# Patient Record
Sex: Male | Born: 1960 | Race: White | Hispanic: No | Marital: Married | Smoking: Never smoker
Health system: Southern US, Community
[De-identification: ages and names within clinical notes are randomized; demographics above are authoritative.]

## PROBLEM LIST (undated history)

## (undated) DIAGNOSIS — Z978 Presence of other specified devices: Secondary | ICD-10-CM

## (undated) DIAGNOSIS — Z960 Presence of urogenital implants: Secondary | ICD-10-CM

## (undated) DIAGNOSIS — N200 Calculus of kidney: Secondary | ICD-10-CM

## (undated) DIAGNOSIS — G1221 Amyotrophic lateral sclerosis: Secondary | ICD-10-CM

## (undated) DIAGNOSIS — R29898 Other symptoms and signs involving the musculoskeletal system: Secondary | ICD-10-CM

## (undated) HISTORY — PX: APPENDECTOMY: SHX54

## (undated) HISTORY — PX: KIDNEY STONE SURGERY: SHX686

## (undated) HISTORY — DX: Other symptoms and signs involving the musculoskeletal system: R29.898

---

## 2008-10-12 ENCOUNTER — Emergency Department (HOSPITAL_BASED_OUTPATIENT_CLINIC_OR_DEPARTMENT_OTHER): Admission: EM | Admit: 2008-10-12 | Discharge: 2008-10-12 | Payer: Self-pay | Admitting: Emergency Medicine

## 2008-10-12 ENCOUNTER — Ambulatory Visit: Payer: Self-pay | Admitting: Radiology

## 2010-10-10 LAB — BASIC METABOLIC PANEL
BUN: 24 mg/dL — ABNORMAL HIGH (ref 6–23)
Calcium: 9.7 mg/dL (ref 8.4–10.5)
Creatinine, Ser: 1.5 mg/dL (ref 0.4–1.5)
GFR calc non Af Amer: 50 mL/min — ABNORMAL LOW (ref >60)
Potassium: 3.9 mEq/L (ref 3.5–5.1)

## 2010-10-10 LAB — DIFFERENTIAL
Eosinophils Relative: 0 % (ref 0–5)
Lymphocytes Relative: 8 % — ABNORMAL LOW (ref 12–46)
Monocytes Relative: 5 % (ref 3–12)
Neutrophils Relative %: 87 % — ABNORMAL HIGH (ref 43–77)
Smear Review: NORMAL

## 2010-10-10 LAB — CBC
HCT: 43.8 % (ref 39.0–52.0)
Platelets: 275 10*3/uL (ref 150–400)
WBC: 19.6 10*3/uL — ABNORMAL HIGH (ref 4.0–10.5)

## 2014-05-04 ENCOUNTER — Encounter: Payer: Self-pay | Admitting: Neurology

## 2014-05-04 ENCOUNTER — Ambulatory Visit (INDEPENDENT_AMBULATORY_CARE_PROVIDER_SITE_OTHER): Payer: BC Managed Care – HMO | Admitting: Neurology

## 2014-05-04 VITALS — BP 130/87 | HR 69 | Ht 72.0 in | Wt 194.0 lb

## 2014-05-04 DIAGNOSIS — R253 Fasciculation: Secondary | ICD-10-CM

## 2014-05-04 DIAGNOSIS — M6250 Muscle wasting and atrophy, not elsewhere classified, unspecified site: Secondary | ICD-10-CM | POA: Insufficient documentation

## 2014-05-04 DIAGNOSIS — R29898 Other symptoms and signs involving the musculoskeletal system: Secondary | ICD-10-CM

## 2014-05-04 DIAGNOSIS — M6289 Other specified disorders of muscle: Secondary | ICD-10-CM

## 2014-05-04 NOTE — Progress Notes (Signed)
PATIENT: Steven Burns DOB: [birth year]  HISTORICAL  Steven Burns is a 53 years old left-handed Caucasian male, referred by his primary care physician Dr. Leighton Ruff for evaluation of painless muscle atrophy, weakness, fasciculations.  He was previously healthy, was an active bike rider, he noticed gradual onset painless left hand muscle weakness around July 2015, also noticed left leg weakness, left leg giveaway sometimes, he attributed his symptoms to a prolonged bike riding of 200 miles in June 2015.  Over the past few months, he also noticed left arm muscle atrophy, fasciculations, no sensory loss, no dysarthria, no dysphagia, no low back pain, no incontinence.    REVIEW OF SYSTEMS: Full 14 system review of systems performed and notable only for  Numbness, weakness, tremor, restless leg  ALLERGIES: Allergies not on file  HOME MEDICATIONS: No current outpatient prescriptions on file prior to visit.   No current facility-administered medications on file prior to visit.    PAST MEDICAL HISTORY: Past Medical History  Diagnosis Date  . Left arm weakness   . Left hand weakness   . Left leg weakness     PAST SURGICAL HISTORY: Past Surgical History  Procedure Laterality Date  . Appendectomy      FAMILY HISTORY: Family History  Problem Relation Age of Onset  . Cancer      SOCIAL HISTORY:  History   Social History  . Marital Status: Married    Spouse Name: Steven Burns    Number of Children: 2  . Years of Education: college   Occupational History  . 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 Smoking status: Never Smoker   . Smokeless tobacco: Never Used  . Alcohol Use: 0.0 oz/week    0 Not specified per week     Comment: oner per week   . Drug Use: No  . Sexual Activity: Not on file   Other Topics Concern  . Not on file   Social History Narrative   Patient lives at home with his wife Steven Burns). Patient works full  time for [employer].    Education Cytogeneticist.   Left handed.   Caffeine one cup daily.   PHYSICAL EXAM   Filed Vitals:   05/04/14 0953  BP: 130/87  Pulse: 69  Height: 6' (1.829 m)  Weight: 194 lb (87.998 kg)    Not recorded      Body mass index is 26.31 kg/(m^2).   Generalized: In no acute distress  Neck: Supple, no carotid bruits   Cardiac: Regular rate rhythm  Pulmonary: Clear to auscultation bilaterally  Musculoskeletal: No deformity  Neurological examination  Mentation: Alert oriented to time, place, history taking, and causual conversation  Cranial nerve II-XII: Pupils were equal round reactive to light. Extraocular movements were full.  Visual field were full on confrontational test. Bilateral fundi were sharp.  Facial sensation and strength were normal. Hearing was intact to finger rubbing bilaterally. Uvula tongue midline.  Head turning and shoulder shrug and were normal and symmetric.Tongue protrusion into cheek strength was normal.  Motor: he has left hand, especially abductor pollicis brevis, left forearm flexor compartment muscle atrophy, frequent muscle fasciculation at left biceps, and the left forearm, weakness of left arm, and left leg. Shoulder abduction (R/L) 5/4, shoulder external rotation 5/4, elbow flexion 5/4+, elbow extension 5 5, wrist flexion 5/4, wrist extension 5/4, grip 5/4, finger abduction 5/4, abductor pollicis brevis 5/4, opponents 5/3,  Hip flexion 5/4  plus, knee extension 5 5, knee flexion 5 5, ankle dorsiflexion 5/4, ankle plantar flexion 5/5  Sensory: Intact to fine touch, pinprick, preserved vibratory sensation, and proprioception at toes.  Coordination: Normal finger to nose, heel-to-shin bilaterally there was no truncal ataxia  Gait: Rising up from seated position without assistance, normal stance, without trunk ataxia, moderate stride, good arm swing, smooth turning, able to perform tiptoe, and heel walking without  difficulty.   Romberg signs: Negative  Deep tendon reflexes: Brachioradialis 3/3, biceps 3/3, triceps 3/3, patellar 3/3, Achilles 2/2, plantar responses were extensor bilaterally.   DIAGNOSTIC DATA (LABS, IMAGING, TESTING) - I reviewed patient records, labs, notes, testing and imaging myself where available.  Lab Results  Component Value Date   WBC 19.6 REPEATED TO VERIFY* 10/12/2008   HGB 14.9 10/12/2008   HCT 43.8 10/12/2008   MCV 91.9 10/12/2008   PLT 275 10/12/2008      Component Value Date/Time   NA 142 10/12/2008 1215   K 3.9 10/12/2008 1215   CL 106 10/12/2008 1215   CO2 23 10/12/2008 1215   GLUCOSE 129* 10/12/2008 1215   BUN 24* 10/12/2008 1215   CREATININE 1.5 10/12/2008 1215   CALCIUM 9.7 10/12/2008 1215   GFRNONAA 50* 10/12/2008 1215   GFRAA  10/12/2008 1215    >60        The eGFR has been calculated using the MDRD equation. This calculation has not been validated in all clinical situations. eGFR's persistently <60 mL/min signify possible Chronic Kidney Disease.   ASSESSMENT AND PLAN  Steven Burns is a 53 y.o. male presenting with painless arm, hand, left leg weakness,on examination, he has left upper extremity muscle atrophy, frequent  Muscle fasciculations, hyperreflexia, bilateral Babinski signs, most suggestive of motor neuron disease,  1. Complete evaluation with MRI of the neural axis 2. Laboratory evaluations 3. EMG nerve conduction study  Steven Burns, M.D. Ph.D.  [facility] [address], [place], [place] [address] [phone]

## 2014-05-06 ENCOUNTER — Telehealth: Payer: Self-pay | Admitting: Neurology

## 2014-05-06 NOTE — Telephone Encounter (Signed)
Annabelle HarmanDana, Please connected me case to case review

## 2014-05-06 NOTE — Telephone Encounter (Signed)
I have left message, there was no significant abnormalities on Lab result. Will discuss detail at his follow up visit

## 2014-05-06 NOTE — Telephone Encounter (Signed)
MRI Brain, Cervical, Thoracic, and Lumbar are in case review, mri lumbar does not meet guidelines per nurse reviewer and has sent to doctor for peer to peer.  Please call [phone] for review.

## 2014-05-10 ENCOUNTER — Encounter: Payer: BC Managed Care – HMO | Admitting: Neurology

## 2014-05-10 ENCOUNTER — Encounter: Payer: BC Managed Care – HMO | Admitting: Radiology

## 2014-05-12 ENCOUNTER — Ambulatory Visit (INDEPENDENT_AMBULATORY_CARE_PROVIDER_SITE_OTHER): Payer: BC Managed Care – HMO

## 2014-05-12 DIAGNOSIS — R253 Fasciculation: Secondary | ICD-10-CM

## 2014-05-12 DIAGNOSIS — M6289 Other specified disorders of muscle: Secondary | ICD-10-CM

## 2014-05-12 DIAGNOSIS — R29898 Other symptoms and signs involving the musculoskeletal system: Secondary | ICD-10-CM

## 2014-05-12 DIAGNOSIS — M6250 Muscle wasting and atrophy, not elsewhere classified, unspecified site: Secondary | ICD-10-CM

## 2014-05-13 LAB — IRON AND TIBC
Iron Saturation: 14 % — ABNORMAL LOW (ref 15–55)
Iron: 50 ug/dL (ref 40–155)
TIBC: 355 ug/dL (ref 250–450)
UIBC: 305 ug/dL (ref 150–375)

## 2014-05-13 LAB — ANA W/REFLEX IF POSITIVE
ANA: POSITIVE — AB
Chromatin Ab SerPl-aCnc: 1 AI — ABNORMAL HIGH (ref 0.0–0.9)
DSDNA AB: 3 [IU]/mL (ref 0–9)
ENA RNP Ab: 0.2 AI (ref 0.0–0.9)
ENA SM Ab Ser-aCnc: 0.2 AI (ref 0.0–0.9)
ENA SSA (RO) AB: 0.2 AI (ref 0.0–0.9)
ENA SSB (LA) Ab: <0.2 AI (ref 0.0–0.9)
Scleroderma SCL-70: <0.2 AI (ref 0.0–0.9)

## 2014-05-13 LAB — IFE AND PE, SERUM
ALBUMIN SERPL ELPH-MCNC: 4.2 g/dL (ref 3.2–5.6)
ALPHA 1: 0.2 g/dL (ref 0.1–0.4)
Albumin/Glob SerPl: 1.4 (ref 0.7–2.0)
Alpha2 Glob SerPl Elph-Mcnc: 0.5 g/dL (ref 0.4–1.2)
B-Globulin SerPl Elph-Mcnc: 1 g/dL (ref 0.6–1.3)
Gamma Glob SerPl Elph-Mcnc: 1.4 g/dL (ref 0.5–1.6)
Globulin, Total: 3.1 g/dL (ref 2.0–4.5)
IGA/IMMUNOGLOBULIN A, SERUM: 291 mg/dL (ref 91–414)
IGM (IMMUNOGLOBULIN M), SRM: 127 mg/dL (ref 40–230)
IgG (Immunoglobin G), Serum: 1543 mg/dL (ref 700–1600)
TOTAL PROTEIN: 7.3 g/dL (ref 6.0–8.5)

## 2014-05-13 LAB — COPPER, SERUM: Copper: 85 ug/dL (ref 72–166)

## 2014-05-13 LAB — HIV ANTIBODY (ROUTINE TESTING W REFLEX)
HIV 1/O/2 Abs-Index Value: <1 (ref <1.00)
HIV-1/HIV-2 Ab: NONREACTIVE

## 2014-05-13 LAB — FERRITIN: FERRITIN: 67 ng/mL (ref 30–400)

## 2014-05-13 LAB — ANGIOTENSIN CONVERTING ENZYME: ANGIO CONVERT ENZYME: 58 U/L (ref 14–82)

## 2014-05-13 LAB — ACETYLCHOLINE RECEPTOR, BINDING: AChR Binding Ab, Serum: 0.04 nmol/L (ref 0.00–0.24)

## 2014-05-13 LAB — LYME, TOTAL AB TEST/REFLEX: Lyme IgG/IgM Ab: <0.91 {ISR} (ref 0.00–0.90)

## 2014-05-13 LAB — ACETYLCHOLINE RECEPTOR, MODULATING

## 2014-05-13 LAB — FOLATE: Folate: 10.5 ng/mL (ref >3.0)

## 2014-05-13 LAB — SEDIMENTATION RATE: SED RATE: 3 mm/h (ref 0–30)

## 2014-05-13 LAB — CK: CK TOTAL: 142 U/L (ref 24–204)

## 2014-05-13 NOTE — Progress Notes (Signed)
Quick Note:  Called and spoke to patient told him Normal labs and to keep ncv/emg appt. Patient understood. ______

## 2014-05-17 ENCOUNTER — Encounter (INDEPENDENT_AMBULATORY_CARE_PROVIDER_SITE_OTHER): Payer: Self-pay

## 2014-05-17 ENCOUNTER — Ambulatory Visit (INDEPENDENT_AMBULATORY_CARE_PROVIDER_SITE_OTHER): Payer: BC Managed Care – HMO | Admitting: Neurology

## 2014-05-17 DIAGNOSIS — Z0289 Encounter for other administrative examinations: Secondary | ICD-10-CM

## 2014-05-17 DIAGNOSIS — G1220 Motor neuron disease, unspecified: Secondary | ICD-10-CM

## 2014-05-17 DIAGNOSIS — R29898 Other symptoms and signs involving the musculoskeletal system: Secondary | ICD-10-CM

## 2014-05-17 DIAGNOSIS — R253 Fasciculation: Secondary | ICD-10-CM

## 2014-05-17 DIAGNOSIS — M6250 Muscle wasting and atrophy, not elsewhere classified, unspecified site: Secondary | ICD-10-CM

## 2014-05-17 DIAGNOSIS — M6289 Other specified disorders of muscle: Secondary | ICD-10-CM

## 2014-05-17 MED ORDER — RILUZOLE 50 MG PO TABS: 50.0000 mg | ORAL_TABLET | Freq: Two times a day (BID) | ORAL | Status: DC

## 2014-05-17 NOTE — Procedures (Addendum)
   NCS (NERVE CONDUCTION STUDY) WITH EMG (ELECTROMYOGRAPHY) REPORT   STUDY DATE: May 17 2014 PATIENT NAME: Steven Burns DOB: [birth year] MRN: [id number]    TECHNOLOGIST: Gearldine ShownLorraine Jones ELECTROMYOGRAPHER: Levert FeinsteinYan, Londynn Sonoda M.D.  CLINICAL INFORMATION:  53 years old gentleman, with painless muscle atrophy, weakness since June 2015, starting from his left hand, now spreading to his left arm, left leg, mild weakness of right side, no sensory loss  On examination: He has frequent muscle atrophy involving bilateral upper, and the lower extremity, there was also evidence of tongue muscle atrophy, he has mild to moderate left arm weakness, mild left more than right leg muscle weakness. Hyperreflexia, bilateral Babinski signs, he has no sensory loss.  FINDINGS: NERVE CONDUCTION STUDY: Left sural sensory response was normal. Left peroneal to EDB, tibial motor responses were normal. Left ulnar sensory and motor responses were normal. Left median sensory response was normal. Left median motor response showed moderately decreased C amplitude, with normal distal latency, conduction velocity, and F-wave latency. There was no evidence of conduction block  NEEDLE ELECTROMYOGRAPHY:  Selected needle examination was performed at left upper extremity muscles, left lower extremity muscles, left lumbosacral paraspinal muscles, left thoracic paraspinal muscles, left sternocleidomastoid, left genioglossus.  Left tibialis anterior, tibialis posterior, medial gastrocnemius, vastus lateralis, left biceps femoris long head: Increased insertion activity,  Occasionally muscle fasciculations, 1 plus spontaneous activity, enlarged complex motor unit potential, with decreased recruitment patterns.  Left biceps, triceps, extensor digital communis, left deltoid: Increased insertion activity, 1 plus spontaneous activity, muscle fasciculations, enlarged complex motor unit potential, with decreased recruitment patterns.  Left  thoracic paraspinals, increased insertion activity, 1 plus spontaneous activity at T10, there was no spontaneous activity noticed at left T9, and left T11  Left sternocleidomastoid, normally insertion activity, no spontaneous activity, normal morphology motor unit potential, mild decreased recruitment patterns  Left genioglossus: normally insertion activity, no spontaneous activity, normal morphology motor unit potential, with normal recruitment patterns.   IMPRESSION:   This is an abnormal study. 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  INTERPRETING PHYSICIAN:   Levert FeinsteinYan, Krista Som M.D. Ph.D. [facility] [address], [address] [place], [place] [id number] [phone]

## 2014-05-17 NOTE — Progress Notes (Signed)
PATIENT: Steven Burns DOB: [birth year]  HISTORICAL  Steven Burns is a 53 years old left-handed Caucasian male, referred by his primary care physician Dr. Leighton Ruff for evaluation of painless muscle atrophy, weakness, fasciculations.  He was previously healthy, was an active bike rider, he noticed gradual onset painless left hand muscle weakness around July 2015, also noticed left leg weakness, left leg giveaway sometimes, he attributed his symptoms to a prolonged bike riding of 200 miles in June 2015.  Over the past few months, he also noticed left arm muscle atrophy, fasciculations, no sensory loss, no dysarthria, no dysphagia, no low back pain, no incontinence.    UPDATE Nov 17th 2015: Patient returned with his wife for electrodiagnostic study today, which has demonstrated widespread chronic neuropathic changes involving left cervical, lumbar sacral, thoracic myotomes,in addition there are frequent muscle fasciculations, consistent with a diagnosis of motor neuron disease  He continued to experience progression of weakness, now feel mild weakness of his right side, continued significant weakness of his left hand, left arm, left leg, gait difficulty, no sensory changes, no bulbar weakness  Extensive laboratory evaluations was normal or negative, including CMP, CBC, HIV, RPR, I78, folic acid,  Immunal fixative protein electrophoresis,Lyme titer, ESR, CPK, C-reactive protein, only abnormality is mildly low iron saturation 14, positive ANA.  MRI of brain,  Cervical spine, lumbar spine, thoracic spine showed mild degenerative disc disease, no significant structural Lesions  REVIEW OF SYSTEMS: Full 14 system review of systems performed and notable only for as above  ALLERGIES: Not on File  HOME MEDICATIONS: Current Outpatient Prescriptions on File Prior to Visit  Medication Sig Dispense Refill  . loratadine (CLARITIN) 10 MG tablet Take 10 mg by mouth daily.     No current  facility-administered medications on file prior to visit.    PAST MEDICAL HISTORY: Past Medical History  Diagnosis Date  . Left arm weakness   . Left hand weakness   . Left leg weakness     PAST SURGICAL HISTORY: Past Surgical History  Procedure Laterality Date  . Appendectomy      FAMILY HISTORY: Family History  Problem Relation Age of Onset  . Cancer      SOCIAL HISTORY:  History   Social History  . Marital Status: Married    Spouse Name: Mickel Baas    Number of Children: 2  . Years of Education: college   Occupational History  . 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 Smoking status: Never Smoker   . Smokeless tobacco: Never Used  . Alcohol Use: 0.0 oz/week    0 Not specified per week     Comment: oner per week   . Drug Use: No  . Sexual Activity: Not on file   Other Topics Concern  . Not on file   Social History Narrative   Patient lives at home with his wife Mickel Baas). Patient works full time for [employer].    Education Cytogeneticist.   Left handed.   Caffeine one cup daily.   PHYSICAL EXAM   There were no vitals filed for this visit.  Not recorded      There is no weight on file to calculate BMI.   Generalized: In no acute distress  Neck: Supple, no carotid bruits   Cardiac: Regular rate rhythm  Pulmonary: Clear to auscultation bilaterally  Musculoskeletal: No deformity  Neurological examination  Mentation: Alert oriented to time, place, history taking,  and causual conversation  Cranial nerve II-XII: Pupils were equal round reactive to light. Extraocular movements were full.  Visual field were full on confrontational test. Bilateral fundi were sharp.  Facial sensation and strength were normal. Hearing was intact to finger rubbing bilaterally. Uvula tongue midline.  Head turning and shoulder shrug and were normal and symmetric.Tongue protrusion into cheek strength was normal. He has mild tongue  muscle fasciculation, mild lateral tongue muscle atrophy,  Motor: he has left hand, especially abductor pollicis brevis, left forearm flexor compartment muscle atrophy, frequent muscle fasciculation at left biceps, and the left forearm, weakness of left arm, and left leg. Shoulder abduction (R/L) 5/4, shoulder external rotation 5/4, elbow flexion 5/4+, elbow extension 5 5, wrist flexion 5/4, wrist extension 5/4, grip 5/4, finger abduction 5/4, abductor pollicis brevis 5/4, opponents 5/3,  Hip flexion 4 plus/4, knee extension 5 5, knee flexion 5/5-, ankle dorsiflexion 5/4, ankle plantar flexion 5/5  Sensory: Intact to fine touch, pinprick, preserved vibratory sensation, and proprioception at toes.  Coordination: Normal finger to nose, heel-to-shin bilaterally there was no truncal ataxia  Gait: Rising up from seated position without assistance, normal stance, without trunk ataxia, moderate stride, good arm swing, smooth turning, he has difficulty with left heel walking, mild difficulty with left tiptoe  Romberg signs: Negative  Deep tendon reflexes: Brachioradialis 3/3, biceps 3/3, triceps 3/3, patellar 3/3, Achilles 2/2, plantar responses were extensor bilaterally.   DIAGNOSTIC DATA (LABS, IMAGING, TESTING) - I reviewed patient records, labs, notes, testing and imaging myself where available.  Lab Results  Component Value Date   WBC 19.6 REPEATED TO VERIFY* 10/12/2008   HGB 14.9 10/12/2008   HCT 43.8 10/12/2008   MCV 91.9 10/12/2008   PLT 275 10/12/2008      Component Value Date/Time   NA 142 10/12/2008 1215   K 3.9 10/12/2008 1215   CL 106 10/12/2008 1215   CO2 23 10/12/2008 1215   GLUCOSE 129* 10/12/2008 1215   BUN 24* 10/12/2008 1215   CREATININE 1.5 10/12/2008 1215   CALCIUM 9.7 10/12/2008 1215   PROT 7.3 05/04/2014 1058   GFRNONAA 50* 10/12/2008 1215   GFRAA  10/12/2008 1215    >60        The eGFR has been calculated using the MDRD equation. This calculation has not  been validated in all clinical situations. eGFR's persistently <60 mL/min signify possible Chronic Kidney Disease.   ASSESSMENT AND PLAN  Steven Burns is a 53 y.o. male presenting with painless arm, hand, left leg weakness,on examination, he has left upper extremity muscle atrophy, frequent  Muscle fasciculations, hyperreflexia, bilateral Babinski signs, electrodiagnostic study showed Chronic neuropathic changes involving left cervical, thoracic, lumbar sacral myotomes consistent with motor neuron disease  1. CT chest without contrast 2, laboratory evaluation, including repeat ANA copper level, 3. Refer him to [facility] 4. return to clinic in 2 months  Marcial Pacas, M.D. Ph.D.  [facility] [address], [place], [place] [address] [phone]

## 2014-05-20 ENCOUNTER — Telehealth: Payer: Self-pay | Admitting: Neurology

## 2014-05-20 NOTE — Telephone Encounter (Signed)
[facility] and they are not taking referrals at this time they have know provider. Can you ask Dr.Yan if patient can wait until February.

## 2014-05-20 NOTE — Telephone Encounter (Signed)
Patient's spouse stated [facility]'t [facility].  Concerned about length of time before appointment.  Questioning if maybe they could get a quicker [facility].  Please call and advise.

## 2014-05-20 NOTE — Telephone Encounter (Signed)
I have called patient and then his wife, at [phone], Vernona RiegerLaura, left message.

## 2014-05-24 NOTE — Telephone Encounter (Signed)
I have called his wife, he continue to have progressive weakness, he has appointment with [facility] in February 2016  I have suggested him to keep the appointment, continue observing his symptoms, call clinic for new issues

## 2014-06-07 ENCOUNTER — Telehealth: Payer: Self-pay | Admitting: Neurology

## 2014-06-07 NOTE — Telephone Encounter (Signed)
CT chest to rule of R69 paraneoplastic syndrome, he has motor neuron disease

## 2014-06-08 NOTE — Telephone Encounter (Signed)
CT has been approved

## 2014-06-17 ENCOUNTER — Ambulatory Visit
Admission: RE | Admit: 2014-06-17 | Discharge: 2014-06-17 | Disposition: A | Discharge status: Home / Self Care | Payer: BC Managed Care – PPO | Source: Ambulatory Visit | Attending: Neurology | Admitting: Neurology

## 2014-06-17 DIAGNOSIS — R29898 Other symptoms and signs involving the musculoskeletal system: Secondary | ICD-10-CM

## 2014-06-17 DIAGNOSIS — M6250 Muscle wasting and atrophy, not elsewhere classified, unspecified site: Secondary | ICD-10-CM

## 2014-06-17 DIAGNOSIS — G1220 Motor neuron disease, unspecified: Secondary | ICD-10-CM

## 2014-06-17 DIAGNOSIS — R253 Fasciculation: Secondary | ICD-10-CM

## 2014-06-21 NOTE — Progress Notes (Signed)
Quick Note:  Called patient and left him a message normal CT. ______

## 2014-07-18 ENCOUNTER — Ambulatory Visit (INDEPENDENT_AMBULATORY_CARE_PROVIDER_SITE_OTHER): Payer: BLUE CROSS/BLUE SHIELD | Admitting: Neurology

## 2014-07-18 ENCOUNTER — Encounter: Payer: Self-pay | Admitting: Neurology

## 2014-07-18 VITALS — BP 129/92 | HR 80 | Ht 72.0 in | Wt 194.0 lb

## 2014-07-18 DIAGNOSIS — G1220 Motor neuron disease, unspecified: Secondary | ICD-10-CM

## 2014-07-18 NOTE — Progress Notes (Signed)
PATIENT: Steven Burns DOB: Jun 11, 1961  HISTORICAL  [name] is a 54 years old left-handed Caucasian male, referred by his primary care physician Dr. Leighton Ruff for evaluation of painless muscle atrophy, weakness, fasciculations.  He was previously healthy, was an active bike rider, he noticed gradual onset painless left hand muscle weakness around July 2015, also noticed left leg weakness, left leg giveaway sometimes, he attributed his symptoms to a prolonged bike riding of 200 miles in June 2015.  Over the past few months, he also noticed left arm muscle atrophy, fasciculations, no sensory loss, no dysarthria, no dysphagia, no low back pain, no incontinence.    UPDATE Nov 17th 2015: Patient returned with his wife for electrodiagnostic study today, which has demonstrated widespread chronic neuropathic changes involving left cervical, lumbar sacral, thoracic myotomes,in addition there are frequent muscle fasciculations, consistent with a diagnosis of motor neuron disease  He continued to experience progression of weakness, now feel mild weakness of his right side, continued significant weakness of his left hand, left arm, left leg, gait difficulty, no sensory changes, no bulbar weakness  Extensive laboratory evaluations was normal or negative, including CMP, CBC, HIV, RPR, J94, folic acid,  Immunal fixative protein electrophoresis,Lyme titer, ESR, CPK, C-reactive protein, only abnormality is mildly low iron saturation 14, positive ANA.  MRI of brain,  Cervical spine, lumbar spine, thoracic spine showed mild degenerative disc disease, no significant structural Lesions  UPDATE Jan 18th 2016: He returns with his wife today,he continue to exercise regularly, weightlifting, ride his bike, he had slow worsening left arm, left leg weakness, but no bulbar weakness  [facility]  In February 20 third 2016  CT chest was normal  REVIEW OF SYSTEMS: Full 14 system review of systems  performed and notable only for numbness, achy muscles, muscle cramps, walking difficulties  ALLERGIES: No Known Allergies  HOME MEDICATIONS: Current Outpatient Prescriptions on File Prior to Visit  Medication Sig Dispense Refill  . loratadine (CLARITIN) 10 MG tablet Take 10 mg by mouth daily.    . riluzole (RILUTEK) 50 MG tablet Take 1 tablet (50 mg total) by mouth every 12 (twelve) hours. 60 tablet 11   No current facility-administered medications on file prior to visit.    PAST MEDICAL HISTORY: Past Medical History  Diagnosis Date  . Left arm weakness   . Left hand weakness   . Left leg weakness     PAST SURGICAL HISTORY: Past Surgical History  Procedure Laterality Date  . Appendectomy      FAMILY HISTORY: Family History  Problem Relation Age of Onset  . Cancer      SOCIAL HISTORY:  History   Social History  . Marital Status: Married    Spouse Name: Steven Burns    Number of Children: 2  . Years of Education: college   Occupational History  . 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 Smoking status: Never Smoker   . Smokeless tobacco: Never Used  . Alcohol Use: 0.0 oz/week    0 Not specified per week     Comment: oner per week   . Drug Use: No  . Sexual Activity: Not on file   Other Topics Concern  . Not on file   Social History Narrative   Patient lives at home with his wife Steven Burns). Patient works full time for [employer].    Education Cytogeneticist.   Left handed.   Caffeine one  cup daily.   PHYSICAL EXAM   Filed Vitals:   07/18/14 1054  BP: 129/92  Pulse: 80  Height: 6' (1.829 m)  Weight: 194 lb (87.998 kg)    Not recorded      Body mass index is 26.31 kg/(m^2).   Generalized: In no acute distress  Neck: Supple, no carotid bruits   Cardiac: Regular rate rhythm  Pulmonary: Clear to auscultation bilaterally  Musculoskeletal: No deformity  Neurological examination  Mentation: Alert  oriented to time, place, history taking, and causual conversation  Cranial nerve II-XII: Pupils were equal round reactive to light. Extraocular movements were full.  Visual field were full on confrontational test. Bilateral fundi were sharp.  Facial sensation and strength were normal. Hearing was intact to finger rubbing bilaterally. Uvula tongue midline.  Head turning and shoulder shrug and were normal and symmetric.Tongue protrusion into cheek strength was normal. He has mild tongue muscle fasciculation, mild lateral tongue muscle atrophy,  Motor: he has left hand, especially abductor pollicis brevis, left forearm flexor compartment muscle atrophy, frequent muscle fasciculation at left biceps, and the left forearm, weakness of left arm, and left leg. Shoulder abduction (R/L) 5/4, shoulder external rotation 5/4, elbow flexion 5/4+, elbow extension 5 5, wrist flexion 5/4, wrist extension 5/4, grip 5/4, finger abduction 5/4, abductor pollicis brevis 5/4, opponents 5/3,  Hip flexion 4 plus/4, knee extension 5 5, knee flexion 5/5-, ankle dorsiflexion 5/4, ankle plantar flexion 5/5  Sensory: Intact to fine touch, pinprick, preserved vibratory sensation, and proprioception at toes.  Coordination: Normal finger to nose, heel-to-shin bilaterally there was no truncal ataxia  Gait: Rising up from seated position without assistance, normal stance, without trunk ataxia, moderate stride, good arm swing, smooth turning, he has difficulty with left heel walking, mild difficulty with left tiptoe  Romberg signs: Negative  Deep tendon reflexes: Brachioradialis 3/3, biceps 3/3, triceps 3/3, patellar 3/3, Achilles 2/2, plantar responses were extensor bilaterally.   DIAGNOSTIC DATA (LABS, IMAGING, TESTING) - I reviewed patient records, labs, notes, testing and imaging myself where available.  Lab Results  Component Value Date   WBC 19.6 REPEATED TO VERIFY* 10/12/2008   HGB 14.9 10/12/2008   HCT 43.8 10/12/2008    MCV 91.9 10/12/2008   PLT 275 10/12/2008      Component Value Date/Time   NA 142 10/12/2008 1215   K 3.9 10/12/2008 1215   CL 106 10/12/2008 1215   CO2 23 10/12/2008 1215   GLUCOSE 129* 10/12/2008 1215   BUN 24* 10/12/2008 1215   CREATININE 1.5 10/12/2008 1215   CALCIUM 9.7 10/12/2008 1215   PROT 7.3 05/04/2014 1058   GFRNONAA 50* 10/12/2008 1215   GFRAA  10/12/2008 1215    >60        The eGFR has been calculated using the MDRD equation. This calculation has not been validated in all clinical situations. eGFR's persistently <60 mL/min signify possible Chronic Kidney Disease.   ASSESSMENT AND PLAN  Chas Gautier is a 54 y.o. male presenting with painless arm, hand, left leg weakness,on examination, he has left upper extremity muscle atrophy, frequent  Muscle fasciculations, hyperreflexia, bilateral Babinski signs, electrodiagnostic study showed Chronic neuropathic changes involving left cervical, thoracic, lumbar sacral myotomes consistent with motor neuron disease  1. Keep appointment with [facility] February 20 third 2016  Return if symptoms worsen or fail to improve.   Marcial Pacas, M.D. Ph.D.  [facility] [address] [place], [place] [address] [phone]

## 2014-10-04 ENCOUNTER — Telehealth: Payer: Self-pay | Admitting: Neurology

## 2014-10-04 DIAGNOSIS — G1220 Motor neuron disease, unspecified: Secondary | ICD-10-CM

## 2014-10-04 NOTE — Telephone Encounter (Signed)
Dr. Zannie KehrBedlack is calling to see if he can get a liver function test drawn in next 2 week on patient and have results faxed to him [phone].  Please call him!  Thanks!

## 2014-10-04 NOTE — Telephone Encounter (Signed)
Ok for liver function test?

## 2014-10-05 NOTE — Telephone Encounter (Signed)
He is coming in next week for his lab work.  Says overall he is doing well - good and bad days - still working.  He is still under Dr. Judeth CornfieldBedlack's care - taking Riluzole now.

## 2014-10-10 ENCOUNTER — Telehealth: Payer: Self-pay | Admitting: *Deleted

## 2014-10-10 NOTE — Telephone Encounter (Signed)
Opened in error

## 2014-10-13 ENCOUNTER — Other Ambulatory Visit (INDEPENDENT_AMBULATORY_CARE_PROVIDER_SITE_OTHER): Payer: Self-pay

## 2014-10-13 DIAGNOSIS — G1220 Motor neuron disease, unspecified: Secondary | ICD-10-CM

## 2014-10-13 DIAGNOSIS — Z0289 Encounter for other administrative examinations: Secondary | ICD-10-CM

## 2014-10-14 ENCOUNTER — Telehealth: Payer: Self-pay | Admitting: Neurology

## 2014-10-14 LAB — HEPATIC FUNCTION PANEL
ALBUMIN: 4.3 g/dL (ref 3.5–5.5)
ALT: 115 IU/L — ABNORMAL HIGH (ref 0–44)
AST: 67 IU/L — AB (ref 0–40)
Alkaline Phosphatase: 92 IU/L (ref 39–117)
Bilirubin Total: 1.3 mg/dL — ABNORMAL HIGH (ref 0.0–1.2)
Bilirubin, Direct: 0.29 mg/dL (ref 0.00–0.40)
TOTAL PROTEIN: 7.1 g/dL (ref 6.0–8.5)

## 2014-10-14 NOTE — Telephone Encounter (Signed)
Michelle: call  Patient, mild abnormal LFTs,       Ref Range 1d ago  31mo ago     Total Protein 6.0 - 8.5 g/dL 7.1 7.3    Albumin 3.5 - 5.5 g/dL 4.3     Bilirubin Total 0.0 - 1.2 mg/dL 1.3 (H)     Bilirubin, Direct 0.00 - 0.40 mg/dL 4.540.29     Alkaline Phosphatase 39 - 117 IU/L 92     AST 0 - 40 IU/L 67 (H)     ALT 0 - 44 IU/L 115 (H)    Resulting Agency  [facility]

## 2014-10-14 NOTE — Telephone Encounter (Signed)
Patient aware of results.  Faxed results to Dr. Zannie KehrBedlack at [phone].

## 2014-12-01 ENCOUNTER — Encounter (HOSPITAL_BASED_OUTPATIENT_CLINIC_OR_DEPARTMENT_OTHER): Payer: Self-pay | Admitting: *Deleted

## 2014-12-01 ENCOUNTER — Emergency Department (HOSPITAL_BASED_OUTPATIENT_CLINIC_OR_DEPARTMENT_OTHER)
Admission: EM | Admit: 2014-12-01 | Discharge: 2014-12-01 | Disposition: A | Discharge status: Home / Self Care | Payer: BLUE CROSS/BLUE SHIELD | Attending: Emergency Medicine | Admitting: Emergency Medicine

## 2014-12-01 DIAGNOSIS — Y990 Civilian activity done for income or pay: Secondary | ICD-10-CM | POA: Diagnosis not present

## 2014-12-01 DIAGNOSIS — Z7951 Long term (current) use of inhaled steroids: Secondary | ICD-10-CM | POA: Insufficient documentation

## 2014-12-01 DIAGNOSIS — M6281 Muscle weakness (generalized): Secondary | ICD-10-CM | POA: Insufficient documentation

## 2014-12-01 DIAGNOSIS — Y9389 Activity, other specified: Secondary | ICD-10-CM | POA: Insufficient documentation

## 2014-12-01 DIAGNOSIS — W01110A Fall on same level from slipping, tripping and stumbling with subsequent striking against sharp glass, initial encounter: Secondary | ICD-10-CM | POA: Insufficient documentation

## 2014-12-01 DIAGNOSIS — Z79899 Other long term (current) drug therapy: Secondary | ICD-10-CM | POA: Diagnosis not present

## 2014-12-01 DIAGNOSIS — Z23 Encounter for immunization: Secondary | ICD-10-CM | POA: Diagnosis not present

## 2014-12-01 DIAGNOSIS — Y9289 Other specified places as the place of occurrence of the external cause: Secondary | ICD-10-CM | POA: Insufficient documentation

## 2014-12-01 DIAGNOSIS — S0181XA Laceration without foreign body of other part of head, initial encounter: Secondary | ICD-10-CM

## 2014-12-01 DIAGNOSIS — S01111A Laceration without foreign body of right eyelid and periocular area, initial encounter: Secondary | ICD-10-CM | POA: Insufficient documentation

## 2014-12-01 HISTORY — DX: Amyotrophic lateral sclerosis: G12.21

## 2014-12-01 MED ORDER — TETANUS-DIPHTH-ACELL PERTUSSIS 5-2.5-18.5 LF-MCG/0.5 IM SUSP
0.5000 mL | Freq: Once | INTRAMUSCULAR | Status: AC
  Administered 2014-12-01: 0.5 mL via INTRAMUSCULAR
  Filled 2014-12-01: qty 0.5

## 2014-12-01 MED ORDER — LIDOCAINE-EPINEPHRINE 2 %-1:100000 IJ SOLN
20.0000 mL | Freq: Once | INTRAMUSCULAR | Status: DC
  Filled 2014-12-01: qty 1

## 2014-12-01 NOTE — Discharge Instructions (Signed)
Facial Laceration A facial laceration is a cut on the face. These injuries can be painful and cause bleeding. Some cuts may need to be closed with stitches (sutures), skin adhesive strips, or wound glue. Cuts usually heal quickly but can leave a scar. It can take 1-2 years for the scar to go away completely. HOME CARE   Only take medicines as told by your doctor.  Follow your doctor's instructions for wound care. For Stitches:  Keep the cut clean and dry.  If you have a bandage (dressing), change it at least once a day. Change the bandage if it gets wet or dirty, or as told by your doctor.  Wash the cut with soap and water 2 times a day. Rinse the cut with water. Pat it dry with a clean towel.  Put a thin layer of medicated cream on the cut as told by your doctor.  You may shower after the first 24 hours. Do not soak the cut in water until the stitches are removed.  Have your stitches removed as told by your doctor.  Do not wear any makeup until a few days after your stitches are removed. For Skin Adhesive Strips:  Keep the cut clean and dry.  Do not get the strips wet. You may take a bath, but be careful to keep the cut dry.  If the cut gets wet, pat it dry with a clean towel.  The strips will fall off on their own. Do not remove the strips that are still stuck to the cut. For Wound Glue:  You may shower or take baths. Do not soak or scrub the cut. Do not swim. Avoid heavy sweating until the glue falls off on its own. After a shower or bath, pat the cut dry with a clean towel.  Do not put medicine or makeup on your cut until the glue falls off.  If you have a bandage, do not put tape over the glue.  Avoid lots of sunlight or tanning lamps until the glue falls off.  The glue will fall off on its own in 5-10 days. Do not pick at the glue. After Healing: Put sunscreen on the cut for the first year to reduce your scar. GET HELP RIGHT AWAY IF:   Your cut area gets red,  painful, or puffy (swollen).  You see a yellowish-white fluid (pus) coming from the cut.  You have chills or a fever. MAKE SURE YOU:   Understand these instructions.  Will watch your condition.  Will get help right away if you are not doing well or get worse. Document Released: 12/04/2007 Document Revised: 04/07/2013 Document Reviewed: 01/28/2013 [facility] Patient Information 2015 [place], [place]. This information is not intended to replace advice given to you by your health care provider. Make sure you discuss any questions you have with your health care provider.  Stitches, Staples, or Skin Adhesive Strips  Stitches (sutures), staples, and skin adhesive strips hold the skin together as it heals. They will usually be in place for 7 days or less. HOME CARE  Wash your hands with soap and water before and after you touch your wound.  Only take medicine as told by your doctor.  Cover your wound only if your doctor told you to. Otherwise, leave it open to air.  Do not get your stitches wet or dirty. If they get dirty, dab them gently with a clean washcloth. Wet the washcloth with soapy water. Do not rub. Pat them dry gently.  Do not put medicine or medicated cream on your stitches unless your doctor told you to. °· Do not take out your own stitches or staples. Skin adhesive strips will fall off by themselves. °· Do not pick at the wound. Picking can cause an infection. °· Do not miss your follow-up appointment. °· If you have problems or questions, call your doctor. °GET HELP RIGHT AWAY IF:  °· You have a temperature by mouth above 102° F (38.9° C), not controlled by medicine. °· You have chills. °· You have redness or pain around your stitches. °· There is puffiness (swelling) around your stitches. °· You notice fluid (drainage) from your stitches. °· There is a bad smell coming from your wound. °MAKE SURE YOU: °· Understand these instructions. °· Will watch your condition. °· Will get help if  you are not doing well or get worse. °Document Released: 04/14/2009 Document Revised: 09/09/2011 Document Reviewed: 04/14/2009 °[employer]® Patient Information ©2015 [employer]. This information is not intended to replace advice given to you by your health care provider. Make sure you discuss any questions you have with your health care provider. ° °

## 2014-12-01 NOTE — ED Provider Notes (Signed)
CSN: [id number]     Arrival date & time 12/01/14  1215 History   First MD Initiated Contact with Patient 12/01/14 1230     Chief Complaint  Patient presents with  . Facial Laceration     (Consider location/radiation/quality/duration/timing/severity/associated sxs/prior Treatment) HPI Comments: Pt come in today for c/o laceration to the right eyebrow. He states that his foot got caught on the a wall at work and he fell and his glasses hit his face. No loc. No blurred vision. States that the fall was related to the weakness in his left leg.  The history is provided by the patient. No language interpreter was used.    Past Medical History  Diagnosis Date  . Left arm weakness   . Left hand weakness   . Left leg weakness   . ALS (amyotrophic lateral sclerosis)    Past Surgical History  Procedure Laterality Date  . Appendectomy     Family History  Problem Relation Age of Onset  . Cancer     History  Substance Use Topics  . Smoking status: Never Smoker   . Smokeless tobacco: Never Used  . Alcohol Use: 0.0 oz/week    0 Standard drinks or equivalent per week     Comment: oner per week     Review of Systems  All other systems reviewed and are negative.     Allergies  Review of patient's allergies indicates no known allergies.  Home Medications   Prior to Admission medications   Medication Sig Start Date End Date Taking? Authorizing Provider  fluticasone (FLONASE) 50 MCG/ACT nasal spray Place into both nostrils daily.   Yes Historical Provider, MD  loratadine (CLARITIN) 10 MG tablet Take 10 mg by mouth daily.    Historical Provider, MD  riluzole (RILUTEK) 50 MG tablet Take 1 tablet (50 mg total) by mouth every 12 (twelve) hours. 05/17/14   Levert Feinstein, MD   BP 150/103 mmHg  Pulse 82  Temp(Src) 98.1 F (36.7 C) (Oral)  Resp 20  Wt 194 lb (87.998 kg)  SpO2 96% Physical Exam  Constitutional: He is oriented to person, place, and time. He appears well-developed and  well-nourished.  HENT:  Right Ear: External ear normal.  Left Ear: External ear normal.  Eyes: Conjunctivae and EOM are normal. Pupils are equal, round, and reactive to light.  Cardiovascular: Normal rate and regular rhythm.   Pulmonary/Chest: Effort normal and breath sounds normal.  Musculoskeletal:       Cervical back: Normal.       Thoracic back: Normal.       Lumbar back: Normal.  Neurological: He is alert and oriented to person, place, and time.  Left sided extremity weakness. Left foot in a brace  Skin:  Laceration to the right eyebrow  Nursing note and vitals reviewed.   ED Course  LACERATION REPAIR Date/Time: 12/01/2014 1:03 PM Performed by: Teressa Lower Authorized by: Teressa Lower Consent: Verbal consent obtained. Consent given by: patient Patient identity confirmed: verbally with patient Body area: head/neck Location details: right eyebrow Laceration length: 1.5 cm Anesthesia: local infiltration and see MAR for details Irrigation solution: tap water Skin closure: 5-0 Prolene Number of sutures: 3 Technique: simple Approximation: close Approximation difficulty: simple Patient tolerance: Patient tolerated the procedure well with no immediate complications   (including critical care time) Labs Review Labs Reviewed - No data to display  Imaging Review No results found.   EKG Interpretation None      MDM   Final  diagnoses:  Facial laceration, initial encounter    Wound closed without any problem. Mechanical fall. Don't think any imagining is needed at this time    Teressa LowerVrinda Cordella Nyquist, NP 12/01/14 1304  Zadie Rhineonald Wickline, MD 12/01/14 [phone]

## 2014-12-01 NOTE — ED Notes (Signed)
He tripped at work. Laceration to his right eyebrow when his face hit the corner. of his glasses. No LOC. Hx ALS. He drove himself here. Bleeding controlled.

## 2015-01-07 ENCOUNTER — Emergency Department (HOSPITAL_BASED_OUTPATIENT_CLINIC_OR_DEPARTMENT_OTHER): Payer: BLUE CROSS/BLUE SHIELD

## 2015-01-07 ENCOUNTER — Encounter (HOSPITAL_BASED_OUTPATIENT_CLINIC_OR_DEPARTMENT_OTHER): Payer: Self-pay | Admitting: *Deleted

## 2015-01-07 ENCOUNTER — Emergency Department (HOSPITAL_BASED_OUTPATIENT_CLINIC_OR_DEPARTMENT_OTHER)
Admission: EM | Admit: 2015-01-07 | Discharge: 2015-01-07 | Disposition: A | Discharge status: Home / Self Care | Payer: BLUE CROSS/BLUE SHIELD | Attending: Emergency Medicine | Admitting: Emergency Medicine

## 2015-01-07 DIAGNOSIS — S199XXA Unspecified injury of neck, initial encounter: Secondary | ICD-10-CM | POA: Insufficient documentation

## 2015-01-07 DIAGNOSIS — W01198A Fall on same level from slipping, tripping and stumbling with subsequent striking against other object, initial encounter: Secondary | ICD-10-CM | POA: Insufficient documentation

## 2015-01-07 DIAGNOSIS — S01112A Laceration without foreign body of left eyelid and periocular area, initial encounter: Secondary | ICD-10-CM | POA: Diagnosis not present

## 2015-01-07 DIAGNOSIS — Z79899 Other long term (current) drug therapy: Secondary | ICD-10-CM | POA: Insufficient documentation

## 2015-01-07 DIAGNOSIS — Y9389 Activity, other specified: Secondary | ICD-10-CM | POA: Diagnosis not present

## 2015-01-07 DIAGNOSIS — S0990XA Unspecified injury of head, initial encounter: Secondary | ICD-10-CM

## 2015-01-07 DIAGNOSIS — S2232XA Fracture of one rib, left side, initial encounter for closed fracture: Secondary | ICD-10-CM | POA: Diagnosis not present

## 2015-01-07 DIAGNOSIS — Y998 Other external cause status: Secondary | ICD-10-CM | POA: Insufficient documentation

## 2015-01-07 DIAGNOSIS — Z8669 Personal history of other diseases of the nervous system and sense organs: Secondary | ICD-10-CM | POA: Diagnosis not present

## 2015-01-07 DIAGNOSIS — Z7951 Long term (current) use of inhaled steroids: Secondary | ICD-10-CM | POA: Insufficient documentation

## 2015-01-07 DIAGNOSIS — Y92002 Bathroom of unspecified non-institutional (private) residence single-family (private) house as the place of occurrence of the external cause: Secondary | ICD-10-CM | POA: Insufficient documentation

## 2015-01-07 DIAGNOSIS — Z87442 Personal history of urinary calculi: Secondary | ICD-10-CM | POA: Insufficient documentation

## 2015-01-07 HISTORY — DX: Calculus of kidney: N20.0

## 2015-01-07 MED ORDER — IBUPROFEN 600 MG PO TABS: 600.0000 mg | ORAL_TABLET | Freq: Four times a day (QID) | ORAL | Status: DC | PRN

## 2015-01-07 MED ORDER — HYDROCODONE-ACETAMINOPHEN 5-325 MG PO TABS: 1.0000 | ORAL_TABLET | ORAL | Status: DC | PRN

## 2015-01-07 NOTE — ED Notes (Signed)
Pt in radiology 

## 2015-01-07 NOTE — ED Notes (Signed)
Urinal and IS in hand

## 2015-01-07 NOTE — ED Notes (Addendum)
Dr. Ranae PalmsYelverton into room. Pt here from home with wife. Here s/p fall at home. Reports was dizzy when I got up from bed/ sleeping to go to the b/r r/t meds for ALS. H/o similar. Usually uses cane or walker to get around. 1cm Lac noted to L eyebrow. L upper lip swollen, L upper central tooth minimally loose. L posterior/axillary ribs sore tender. (denies: LOC, visual changes, dizziness at this time while sitting, nv, neck or back pain or other sx). Bleeding controlled. No meds PTA. [facility]. Td UTD with last fall (seen here). Rates pain 1/10. No dyspnea noted. Speech clear. Does not use CPAP at home. LS CTA. No obvious bruising (ribs, face or head).

## 2015-01-07 NOTE — Progress Notes (Signed)
Incentive spirometer teaching done with patient,  2400ml x 8 good efforts.  Patient with good understanding of use and return demonstration of proper technique. 

## 2015-01-07 NOTE — ED Notes (Signed)
Given ice pack for face: lip and eyebrow.

## 2015-01-07 NOTE — ED Notes (Signed)
Dr. [facility] to [facility] eyebrow lac.

## 2015-01-07 NOTE — ED Provider Notes (Signed)
CSN: [id number]     Arrival date & time 01/07/15  0026 History   First MD Initiated Contact with Patient 01/07/15 0037     Chief Complaint  Patient presents with  . Fall     (Consider location/radiation/quality/duration/timing/severity/associated sxs/prior Treatment) HPI Patient with history of illness and residual left-sided weakness. He is on the 3 trial medication for LS causes him to be lightheaded. This is his baseline. States he woke this morning to go to the bathroom and had some lightheadedness. He then fell from standing striking his head on the floor. There is no loss of consciousness. Sustained left eyebrow laceration. Also complains of mild anterior left-sided rib pain. No new weakness or numbness. Mild lower neck pain. Denies back pain. Last tetanus shot was one month ago. Past Medical History  Diagnosis Date  . Left arm weakness   . Left hand weakness   . Left leg weakness   . ALS (amyotrophic lateral sclerosis)   . Kidney stone    Past Surgical History  Procedure Laterality Date  . Appendectomy    . Kidney stone surgery     Family History  Problem Relation Age of Onset  . Cancer     History  Substance Use Topics  . Smoking status: Never Smoker   . Smokeless tobacco: Never Used  . Alcohol Use: 0.0 oz/week    0 Standard drinks or equivalent per week     Comment: oner per week     Review of Systems  Constitutional: Negative for fever and chills.  HENT: Positive for facial swelling.   Eyes: Negative for visual disturbance.  Respiratory: Negative for shortness of breath.   Cardiovascular: Positive for chest pain.  Gastrointestinal: Negative for nausea, vomiting and abdominal pain.  Musculoskeletal: Positive for neck pain. Negative for back pain and neck stiffness.  Skin: Positive for wound.  Neurological: Positive for dizziness and light-headedness. Negative for weakness (no new weakness), numbness and headaches.  All other systems reviewed and are  negative.     Allergies  Review of patient's allergies indicates no known allergies.  Home Medications   Prior to Admission medications   Medication Sig Start Date End Date Taking? Authorizing Provider  fluticasone (FLONASE) 50 MCG/ACT nasal spray Place into both nostrils daily.    Historical Provider, MD  HYDROcodone-acetaminophen (NORCO) 5-325 MG per tablet Take 1 tablet by mouth every 4 (four) hours as needed for severe pain. 01/07/15   Loren Racer, MD  ibuprofen (ADVIL,MOTRIN) 600 MG tablet Take 1 tablet (600 mg total) by mouth every 6 (six) hours as needed. 01/07/15   Loren Racer, MD  loratadine (CLARITIN) 10 MG tablet Take 10 mg by mouth daily.    Historical Provider, MD  riluzole (RILUTEK) 50 MG tablet Take 1 tablet (50 mg total) by mouth every 12 (twelve) hours. 05/17/14   Levert Feinstein, MD   There were no vitals taken for this visit. Physical Exam  Constitutional: He is oriented to person, place, and time. He appears well-developed and well-nourished. No distress.  HENT:  Head: Normocephalic.  Mouth/Throat: Oropharynx is clear and moist.  1 cm laceration to the left eyebrow. No active bleeding. No underlying deformity. This is stable. Patient has swollen left upper lip. Dental exam is normal. No malocclusion  Eyes: EOM are normal. Pupils are equal, round, and reactive to light.  Neck: Normal range of motion. Neck supple.  No posterior midline cervical tenderness to palpation. Patient had some mild bilateral paracervical tenderness in the lower  cervical and upper thoracic spine.  Cardiovascular: Normal rate and regular rhythm.  Exam reveals no gallop and no friction rub.   No murmur heard. Pulmonary/Chest: Effort normal and breath sounds normal. No respiratory distress. He has no wheezes. He has no rales. He exhibits tenderness (patient has mild left sided anterior chest wall pain. There is no obvious contusion or deformity.).  Abdominal: Soft. Bowel sounds are normal. He  exhibits no distension and no mass. There is no tenderness. There is no rebound and no guarding.  Musculoskeletal: Normal range of motion. He exhibits no edema or tenderness.  Neurological: He is alert and oriented to person, place, and time.  5/5 motor in right upper and right lower extremity. 4/5 motor in left upper and left lower extremity. Sensation intact.  Skin: Skin is warm and dry. No rash noted. No erythema.  Psychiatric: He has a normal mood and affect. His behavior is normal.  Nursing note and vitals reviewed.   ED Course  LACERATION REPAIR Date/Time: 01/07/2015 2:18 AM Performed by: Loren RacerYELVERTON, Milika Ventress Authorized by: Ranae PalmsYELVERTON, Fannie Gathright Body area: head/neck Location details: left eyebrow Laceration length: 1 cm Foreign bodies: no foreign bodies Tendon involvement: none Nerve involvement: none Patient sedated: no Irrigation solution: saline Amount of cleaning: standard Skin closure: glue Approximation: close Approximation difficulty: simple   (including critical care time) Labs Review Labs Reviewed - No data to display  Imaging Review Dg Ribs Unilateral W/chest Left  01/07/2015   CLINICAL DATA:  Fall with left posterior and axillary rib tenderness. Initial encounter.  EXAM: LEFT RIBS AND CHEST - 3+ VIEW  COMPARISON:  Chest CT 06/17/2014  FINDINGS: Mildly displaced lateral left sixth rib fracture. No evidence of hemothorax, pneumothorax, or lung contusion. Normal heart size and mediastinal contours.  IMPRESSION: 1. Displaced left sixth rib fracture. 2. No pneumothorax.   Electronically Signed   By: Marnee SpringJonathon  Watts M.D.   On: 01/07/2015 01:27   Ct Head Wo Contrast  01/07/2015   CLINICAL DATA:  Dizziness with fall. Laceration near the left eyebrow. Initial encounter.  EXAM: CT HEAD WITHOUT CONTRAST  TECHNIQUE: Contiguous axial images were obtained from the base of the skull through the vertex without intravenous contrast.  COMPARISON:  None.  FINDINGS: Skull and Sinuses:Negative for  fracture or destructive process. The mastoids, middle ears, and imaged paranasal sinuses are clear.  Orbits: No acute abnormality.  Brain: No infarction, hemorrhage, hydrocephalus, or mass lesion/mass effect.  IMPRESSION: No intracranial injury or fracture.   Electronically Signed   By: Marnee SpringJonathon  Watts M.D.   On: 01/07/2015 01:24     EKG Interpretation None      MDM   Final diagnoses:  Closed head injury, initial encounter  Eyebrow laceration, left, initial encounter  Rib fracture, left, closed, initial encounter    Patient with baseline neurologic status emergency department. Sounds are normal. CT head without any acute findings. Chest x-ray with left 6 rib fracture. Eyebrow laceration repaired in the emergency department. Incentive spirometer given the patient with traction to use. Advised to follow-up with his primary physician. Return precautions given.    Loren Raceravid Gyneth Hubka, MD 01/07/15 [phone]

## 2015-01-07 NOTE — ED Notes (Signed)
Incentive spirometer teaching done with patient,  x 8 good efforts.  Patient with good understanding of use and return demonstration of proper technique.

## 2015-01-07 NOTE — Discharge Instructions (Signed)
Facial Laceration A facial laceration is a cut on the face. These injuries can be painful and cause bleeding. Some cuts may need to be closed with stitches (sutures), skin adhesive strips, or wound glue. Cuts usually heal quickly but can leave a scar. It can take 1-2 years for the scar to go away completely. HOME CARE   Only take medicines as told by your doctor.  Follow your doctor's instructions for wound care. For Stitches:  Keep the cut clean and dry.  If you have a bandage (dressing), change it at least once a day. Change the bandage if it gets wet or dirty, or as told by your doctor.  Wash the cut with soap and water 2 times a day. Rinse the cut with water. Pat it dry with a clean towel.  Put a thin layer of medicated cream on the cut as told by your doctor.  You may shower after the first 24 hours. Do not soak the cut in water until the stitches are removed.  Have your stitches removed as told by your doctor.  Do not wear any makeup until a few days after your stitches are removed. For Skin Adhesive Strips:  Keep the cut clean and dry.  Do not get the strips wet. You may take a bath, but be careful to keep the cut dry.  If the cut gets wet, pat it dry with a clean towel.  The strips will fall off on their own. Do not remove the strips that are still stuck to the cut. For Wound Glue:  You may shower or take baths. Do not soak or scrub the cut. Do not swim. Avoid heavy sweating until the glue falls off on its own. After a shower or bath, pat the cut dry with a clean towel.  Do not put medicine or makeup on your cut until the glue falls off.  If you have a bandage, do not put tape over the glue.  Avoid lots of sunlight or tanning lamps until the glue falls off.  The glue will fall off on its own in 5-10 days. Do not pick at the glue. After Healing: Put sunscreen on the cut for the first year to reduce your scar. GET HELP RIGHT AWAY IF:   Your cut area gets red,  painful, or puffy (swollen).  You see a yellowish-white fluid (pus) coming from the cut.  You have chills or a fever. MAKE SURE YOU:   Understand these instructions.  Will watch your condition.  Will get help right away if you are not doing well or get worse. Document Released: 12/04/2007 Document Revised: 04/07/2013 Document Reviewed: 01/28/2013 [facility] Patient Information 2015 [place], [place]. This information is not intended to replace advice given to you by your health care provider. Make sure you discuss any questions you have with your health care provider.  Head Injury You have received a head injury. It does not appear serious at this time. Headaches and vomiting are common following head injury. It should be easy to awaken from sleeping. Sometimes it is necessary for you to stay in the emergency department for a while for observation. Sometimes admission to the hospital may be needed. After injuries such as yours, most problems occur within the first 24 hours, but side effects may occur up to 7-10 days after the injury. It is important for you to carefully monitor your condition and contact your health care provider or seek immediate medical care if there is a change in  your condition. WHAT ARE THE TYPES OF HEAD INJURIES? Head injuries can be as minor as a bump. Some head injuries can be more severe. More severe head injuries include:  A jarring injury to the brain (concussion).  A bruise of the brain (contusion). This mean there is bleeding in the brain that can cause swelling.  A cracked skull (skull fracture).  Bleeding in the brain that collects, clots, and forms a bump (hematoma). WHAT CAUSES A HEAD INJURY? A serious head injury is most likely to happen to someone who is in a car wreck and is not wearing a seat belt. Other causes of major head injuries include bicycle or motorcycle accidents, sports injuries, and falls. HOW ARE HEAD INJURIES DIAGNOSED? A complete history  of the event leading to the injury and your current symptoms will be helpful in diagnosing head injuries. Many times, pictures of the brain, such as CT or MRI are needed to see the extent of the injury. Often, an overnight hospital stay is necessary for observation.  WHEN SHOULD I SEEK IMMEDIATE MEDICAL CARE?  You should get help right away if:  You have confusion or drowsiness.  You feel sick to your stomach (nauseous) or have continued, forceful vomiting.  You have dizziness or unsteadiness that is getting worse.  You have severe, continued headaches not relieved by medicine. Only take over-the-counter or prescription medicines for pain, fever, or discomfort as directed by your health care provider.  You do not have normal function of the arms or legs or are unable to walk.  You notice changes in the black spots in the center of the colored part of your eye (pupil).  You have a clear or bloody fluid coming from your nose or ears.  You have a loss of vision. During the next 24 hours after the injury, you must stay with someone who can watch you for the warning signs. This person should contact local emergency services (911 in the [place] if you have seizures, you become unconscious, or you are unable to wake up. HOW CAN I PREVENT A HEAD INJURY IN THE FUTURE? The most important factor for preventing major head injuries is avoiding motor vehicle accidents. To minimize the potential for damage to your head, it is crucial to wear seat belts while riding in motor vehicles. Wearing helmets while bike riding and playing collision sports (like football) is also helpful. Also, avoiding dangerous activities around the house will further help reduce your risk of head injury.  WHEN CAN I RETURN TO NORMAL ACTIVITIES AND ATHLETICS? You should be reevaluated by your health care provider before returning to these activities. If you have any of the following symptoms, you should not return to activities or  contact sports until 1 week after the symptoms have stopped:  Persistent headache.  Dizziness or vertigo.  Poor attention and concentration.  Confusion.  Memory problems.  Nausea or vomiting.  Fatigue or tire easily.  Irritability.  Intolerant of bright lights or loud noises.  Anxiety or depression.  Disturbed sleep. MAKE SURE YOU:   Understand these instructions.  Will watch your condition.  Will get help right away if you are not doing well or get worse. Document Released: 06/17/2005 Document Revised: 06/22/2013 Document Reviewed: 02/22/2013 [facility] Patient Information 2015 [place]. This information is not intended to replace advice given to you by your health care provider. Make sure you discuss any questions you have with your health care provider.  Rib Fracture A rib fracture is a  break or crack in one of the bones of the ribs. The ribs are a group of long, curved bones that wrap around your chest and attach to your spine. They protect your lungs and other organs in the chest cavity. A broken or cracked rib is often painful, but most do not cause other problems. Most rib fractures heal on their own over time. However, rib fractures can be more serious if multiple ribs are broken or if broken ribs move out of place and push against other structures. CAUSES   A direct blow to the chest. For example, this could happen during contact sports, a car accident, or a fall against a hard object.  Repetitive movements with high force, such as pitching a baseball or having severe coughing spells. SYMPTOMS   Pain when you breathe in or cough.  Pain when someone presses on the injured area. DIAGNOSIS  Your caregiver will perform a physical exam. Various imaging tests may be ordered to confirm the diagnosis and to look for related injuries. These tests may include a chest X-ray, computed tomography (CT), magnetic resonance imaging (MRI), or a bone scan. TREATMENT  Rib  fractures usually heal on their own in 1-3 months. The longer healing period is often associated with a continued cough or other aggravating activities. During the healing period, pain control is very important. Medication is usually given to control pain. Hospitalization or surgery may be needed for more severe injuries, such as those in which multiple ribs are broken or the ribs have moved out of place.  HOME CARE INSTRUCTIONS   Avoid strenuous activity and any activities or movements that cause pain. Be careful during activities and avoid bumping the injured rib.  Gradually increase activity as directed by your caregiver.  Only take over-the-counter or prescription medications as directed by your caregiver. Do not take other medications without asking your caregiver first.  Apply ice to the injured area for the first 1-2 days after you have been treated or as directed by your caregiver. Applying ice helps to reduce inflammation and pain.  Put ice in a plastic bag.  Place a towel between your skin and the bag.   Leave the ice on for 15-20 minutes at a time, every 2 hours while you are awake.  Perform deep breathing as directed by your caregiver. This will help prevent pneumonia, which is a common complication of a broken rib. Your caregiver may instruct you to:  Take deep breaths several times a day.  Try to cough several times a day, holding a pillow against the injured area.  Use a device called an incentive spirometer to practice deep breathing several times a day.  Drink enough fluids to keep your urine clear or pale yellow. This will help you avoid constipation.   Do not wear a rib belt or binder. These restrict breathing, which can lead to pneumonia.  SEEK IMMEDIATE MEDICAL CARE IF:   You have a fever.   You have difficulty breathing or shortness of breath.   You develop a continual cough, or you cough up thick or bloody sputum.  You feel sick to your stomach  (nausea), throw up (vomit), or have abdominal pain.   You have worsening pain not controlled with medications.  MAKE SURE YOU:  Understand these instructions.  Will watch your condition.  Will get help right away if you are not doing well or get worse. Document Released: 06/17/2005 Document Revised: 02/17/2013 Document Reviewed: 08/19/2012 [facility] Patient Information 2015 [place]. This information is not intended to replace advice given to you by your health care provider. Make sure you discuss any questions you have with your health care provider.

## 2015-02-08 ENCOUNTER — Encounter (HOSPITAL_BASED_OUTPATIENT_CLINIC_OR_DEPARTMENT_OTHER): Payer: Self-pay | Admitting: *Deleted

## 2015-02-08 ENCOUNTER — Emergency Department (HOSPITAL_BASED_OUTPATIENT_CLINIC_OR_DEPARTMENT_OTHER)
Admission: EM | Admit: 2015-02-08 | Discharge: 2015-02-08 | Disposition: A | Discharge status: Home / Self Care | Payer: BLUE CROSS/BLUE SHIELD | Attending: Emergency Medicine | Admitting: Emergency Medicine

## 2015-02-08 DIAGNOSIS — S01111A Laceration without foreign body of right eyelid and periocular area, initial encounter: Secondary | ICD-10-CM | POA: Insufficient documentation

## 2015-02-08 DIAGNOSIS — S0181XA Laceration without foreign body of other part of head, initial encounter: Secondary | ICD-10-CM

## 2015-02-08 DIAGNOSIS — W01198A Fall on same level from slipping, tripping and stumbling with subsequent striking against other object, initial encounter: Secondary | ICD-10-CM | POA: Diagnosis not present

## 2015-02-08 DIAGNOSIS — S199XXA Unspecified injury of neck, initial encounter: Secondary | ICD-10-CM | POA: Diagnosis not present

## 2015-02-08 DIAGNOSIS — S60512A Abrasion of left hand, initial encounter: Secondary | ICD-10-CM | POA: Diagnosis not present

## 2015-02-08 DIAGNOSIS — Y92015 Private garage of single-family (private) house as the place of occurrence of the external cause: Secondary | ICD-10-CM | POA: Insufficient documentation

## 2015-02-08 DIAGNOSIS — Y9389 Activity, other specified: Secondary | ICD-10-CM | POA: Diagnosis not present

## 2015-02-08 DIAGNOSIS — Z7951 Long term (current) use of inhaled steroids: Secondary | ICD-10-CM | POA: Insufficient documentation

## 2015-02-08 DIAGNOSIS — Z8669 Personal history of other diseases of the nervous system and sense organs: Secondary | ICD-10-CM | POA: Diagnosis not present

## 2015-02-08 DIAGNOSIS — W19XXXA Unspecified fall, initial encounter: Secondary | ICD-10-CM

## 2015-02-08 DIAGNOSIS — Z79899 Other long term (current) drug therapy: Secondary | ICD-10-CM | POA: Diagnosis not present

## 2015-02-08 DIAGNOSIS — S50812A Abrasion of left forearm, initial encounter: Secondary | ICD-10-CM | POA: Diagnosis not present

## 2015-02-08 DIAGNOSIS — Z87442 Personal history of urinary calculi: Secondary | ICD-10-CM | POA: Diagnosis not present

## 2015-02-08 DIAGNOSIS — Y998 Other external cause status: Secondary | ICD-10-CM | POA: Diagnosis not present

## 2015-02-08 DIAGNOSIS — S0591XA Unspecified injury of right eye and orbit, initial encounter: Secondary | ICD-10-CM | POA: Diagnosis present

## 2015-02-08 DIAGNOSIS — T07XXXA Unspecified multiple injuries, initial encounter: Secondary | ICD-10-CM

## 2015-02-08 MED ORDER — IBUPROFEN 800 MG PO TABS
800.0000 mg | ORAL_TABLET | Freq: Once | ORAL | Status: AC
  Administered 2015-02-08: 800 mg via ORAL
  Filled 2015-02-08: qty 1

## 2015-02-08 MED ORDER — HYDROCODONE-ACETAMINOPHEN 5-325 MG PO TABS: 1.0000 | ORAL_TABLET | ORAL | Status: DC | PRN

## 2015-02-08 MED ORDER — LIDOCAINE HCL (PF) 1 % IJ SOLN
10.0000 mL | Freq: Once | INTRAMUSCULAR | Status: AC
  Administered 2015-02-08: 10 mL via INTRADERMAL
  Filled 2015-02-08: qty 10

## 2015-02-08 NOTE — ED Notes (Signed)
Pt reports he tripped on crack in garage cement floor and fell. Denies LOC and states he remembers all events prior during and after fall. Currently alert x4 verbally appropriate response to all questions.

## 2015-02-08 NOTE — ED Notes (Signed)
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 He had returned home this afternoon and became dizzy while walking into the house, falling and injuring his face, hands and left knee. Pt has abrasions to right side of face, his left knuckles and left knee.

## 2015-02-08 NOTE — ED Provider Notes (Signed)
CSN: [id number]     Arrival date & time 02/08/15  1831 History   First MD Initiated Contact with Patient 02/08/15 1909     Chief Complaint  Patient presents with  . 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 Patient lost his footing on the left side, fell forward hitting his right eye and face. Multiple abrasions on the hand, forearm. He has some mild neck stiffness, he denies loss of consciousness, headache, nausea, changes in vision. Patient is up-to-date on his tetanus vaccination.  Past Medical History  Diagnosis Date  . Left arm weakness   . Left hand weakness   . Left leg weakness   . ALS (amyotrophic lateral sclerosis)   . Kidney stone    Past Surgical History  Procedure Laterality Date  . Appendectomy    . Kidney stone surgery     Family History  Problem Relation Age of Onset  . Cancer     Social History  Substance Use Topics  . Smoking status: Never Smoker   . Smokeless tobacco: Never Used  . Alcohol Use: 0.0 oz/week    0 Standard drinks or equivalent per week     Comment: oner per week     Review of Systems  Ten systems are reviewed and are negative for acute change except as noted in the HPI   Allergies  Review of patient's allergies indicates no known allergies.  Home Medications   Prior to Admission medications   Medication Sig Start Date End Date Taking? Authorizing Provider  fluticasone (FLONASE) 50 MCG/ACT nasal spray Place into both nostrils daily.    Historical Provider, MD  HYDROcodone-acetaminophen (NORCO) 5-325 MG per tablet Take 1 tablet by mouth every 4 (four) hours as needed for severe pain. 01/07/15   Loren Racer, MD  ibuprofen (ADVIL,MOTRIN) 600 MG tablet Take 1 tablet (600 mg total) by mouth  every 6 (six) hours as needed. 01/07/15   Loren Racer, MD  loratadine (CLARITIN) 10 MG tablet Take 10 mg by mouth daily.    Historical Provider, MD  riluzole (RILUTEK) 50 MG tablet Take 1 tablet (50 mg total) by mouth every 12 (twelve) hours. 05/17/14   Levert Feinstein, MD   BP 130/99 mmHg  Pulse 81  Temp(Src) 99.1 F (37.3 C) (Oral)  Resp 18  Ht 6\' 1"  (1.854 m)  Wt 191 lb 12.8 oz (87 kg)  BMI 25.31 kg/m2  SpO2 98% Physical Exam  Constitutional: He is oriented to person, place, and time. He appears well-developed and well-nourished. No distress.  HENT:  Head: Normocephalic. Head is with abrasion.    Eyes: Conjunctivae and EOM are normal. Pupils are equal, round, and reactive to light. No scleral icterus.    Neck: Normal range of motion. Neck supple. Muscular tenderness present. No spinous process tenderness present.    Cardiovascular: Normal rate, regular rhythm and normal heart sounds.   Pulmonary/Chest: Effort normal and breath sounds normal. No respiratory distress.  Abdominal: Soft. There is no tenderness.  Musculoskeletal: He exhibits no edema.       Hands: Neurological: He is alert and oriented to person, place, and time. No cranial nerve deficit.  Skin: Skin is warm and dry. He is not diaphoretic.  Psychiatric: His behavior is normal.  Nursing note and vitals reviewed.   ED Course  LACERATION  REPAIR Date/Time: 02/08/2015 9:12 PM Performed by: Arthor Captain Authorized by: Arthor Captain Consent: Verbal consent obtained. Risks and benefits: risks, benefits and alternatives were discussed Patient identity confirmed: verbally with patient Body area: head/neck Location details: right eyelid Laceration length: 6 cm Foreign bodies: no foreign bodies Tendon involvement: complex (Involoving Galea Aponeurotica) Nerve involvement: none Vascular damage: no Local anesthetic: lidocaine 1% without epinephrine Anesthetic total: 8 ml Preparation: Patient was prepped and  draped in the usual sterile fashion. Irrigation solution: saline Irrigation method: syringe Amount of cleaning: standard Debridement: none Degree of undermining: none Skin closure: 5-0 Prolene Wound fascia closure material used: 4.0 chromic gut. Wound tendon closure material used: 4.0 chromic  Number of sutures: 7 Technique: simple and complex Approximation: close Patient tolerance: Patient tolerated the procedure well with no immediate complications   (including critical care time) Labs Review Labs Reviewed - No data to display  Imaging Review No results found.   EKG Interpretation None      MDM   Final diagnoses:  None    Patient with multiple abrasions, fall, no altered mental status, GCS 15. No signs of entrapment with laceration of the eye, no signs of facial fracture. Laceration, complicated involving the Dalia. Repair done with chromic gut suture. Patient is able to raise his eyelid equally after repair. Patient will be discharged. He may follow up with facial plastics if needed. Tetanus up-to-date. He appears safe for discharge at this time        Arthor Captain, PA-C 02/08/15 2141  Mirian Mo, MD 02/09/15 [phone]

## 2015-02-08 NOTE — Discharge Instructions (Signed)
Laceration Care, Adult °A laceration is a cut or lesion that goes through all layers of the skin and into the tissue just beneath the skin. °TREATMENT  °Some lacerations may not require closure. Some lacerations may not be able to be closed due to an increased risk of infection. It is important to see your caregiver as soon as possible after an injury to minimize the risk of infection and maximize the opportunity for successful closure. °If closure is appropriate, pain medicines may be given, if needed. The wound will be cleaned to help prevent infection. Your caregiver will use stitches (sutures), staples, wound glue (adhesive), or skin adhesive strips to repair the laceration. These tools bring the skin edges together to allow for faster healing and a better cosmetic outcome. However, all wounds will heal with a scar. Once the wound has healed, scarring can be minimized by covering the wound with sunscreen during the day for 1 full year. °HOME CARE INSTRUCTIONS  °For sutures or staples: °· Keep the wound clean and dry. °· If you were given a bandage (dressing), you should change it at least once a day. Also, change the dressing if it becomes wet or dirty, or as directed by your caregiver. °· Wash the wound with soap and water 2 times a day. Rinse the wound off with water to remove all soap. Pat the wound dry with a clean towel. °· After cleaning, apply a thin layer of the antibiotic ointment as recommended by your caregiver. This will help prevent infection and keep the dressing from sticking. °· You may shower as usual after the first 24 hours. Do not soak the wound in water until the sutures are removed. °· Only take over-the-counter or prescription medicines for pain, discomfort, or fever as directed by your caregiver. °· Get your sutures or staples removed as directed by your caregiver. °For skin adhesive strips: °· Keep the wound clean and dry. °· Do not get the skin adhesive strips wet. You may bathe  carefully, using caution to keep the wound dry. °· If the wound gets wet, pat it dry with a clean towel. °· Skin adhesive strips will fall off on their own. You may trim the strips as the wound heals. Do not remove skin adhesive strips that are still stuck to the wound. They will fall off in time. °For wound adhesive: °· You may briefly wet your wound in the shower or bath. Do not soak or scrub the wound. Do not swim. Avoid periods of heavy perspiration until the skin adhesive has fallen off on its own. After showering or bathing, gently pat the wound dry with a clean towel. °· Do not apply liquid medicine, cream medicine, or ointment medicine to your wound while the skin adhesive is in place. This may loosen the film before your wound is healed. °· If a dressing is placed over the wound, be careful not to apply tape directly over the skin adhesive. This may cause the adhesive to be pulled off before the wound is healed. °· Avoid prolonged exposure to sunlight or tanning lamps while the skin adhesive is in place. Exposure to ultraviolet light in the first year will darken the scar. °· The skin adhesive will usually remain in place for 5 to 10 days, then naturally fall off the skin. Do not pick at the adhesive film. °You may need a tetanus shot if: °· You cannot remember when you had your last tetanus shot. °· You have never had a tetanus   shot. If you get a tetanus shot, your arm may swell, get red, and feel warm to the touch. This is common and not a problem. If you need a tetanus shot and you choose not to have one, there is a rare chance of getting tetanus. Sickness from tetanus can be serious. SEEK MEDICAL CARE IF:   You have redness, swelling, or increasing pain in the wound.  You see a red line that goes away from the wound.  You have yellowish-white fluid (pus) coming from the wound.  You have a fever.  You notice a bad smell coming from the wound or dressing.  Your wound breaks open before or  after sutures have been removed.  You notice something coming out of the wound such as wood or glass.  Your wound is on your hand or foot and you cannot move a finger or toe. SEEK IMMEDIATE MEDICAL CARE IF:   Your pain is not controlled with prescribed medicine.  You have severe swelling around the wound causing pain and numbness or a change in color in your arm, hand, leg, or foot.  Your wound splits open and starts bleeding.  You have worsening numbness, weakness, or loss of function of any joint around or beyond the wound.  You develop painful lumps near the wound or on the skin anywhere on your body. MAKE SURE YOU:   Understand these instructions.  Will watch your condition.  Will get help right away if you are not doing well or get worse. Document Released: 06/17/2005 Document Revised: 09/09/2011 Document Reviewed: 12/11/2010 [facility] Patient Information 2015 [place], [place]. This information is not intended to replace advice given to you by your health care provider. Make sure you discuss any questions you have with your health care provider.   Facial Laceration  A facial laceration is a cut on the face. These injuries can be painful and cause bleeding. Lacerations usually heal quickly, but they need special care to reduce scarring. DIAGNOSIS  Your health care provider will take a medical history, ask for details about how the injury occurred, and examine the wound to determine how deep the cut is. TREATMENT  Some facial lacerations may not require closure. Others may not be able to be closed because of an increased risk of infection. The risk of infection and the chance for successful closure will depend on various factors, including the amount of time since the injury occurred. The wound may be cleaned to help prevent infection. If closure is appropriate, pain medicines may be given if needed. Your health care provider will use stitches (sutures), wound glue (adhesive), or  skin adhesive strips to repair the laceration. These tools bring the skin edges together to allow for faster healing and a better cosmetic outcome. If needed, you may also be given a tetanus shot. HOME CARE INSTRUCTIONS  Only take over-the-counter or prescription medicines as directed by your health care provider.  Follow your health care provider's instructions for wound care. These instructions will vary depending on the technique used for closing the wound. For Sutures:  Keep the wound clean and dry.   If you were given a bandage (dressing), you should change it at least once a day. Also change the dressing if it becomes wet or dirty, or as directed by your health care provider.   Wash the wound with soap and water 2 times a day. Rinse the wound off with water to remove all soap. Pat the wound dry with a clean towel.  After cleaning, apply a thin layer of the antibiotic ointment recommended by your health care provider. This will help prevent infection and keep the dressing from sticking.   You may shower as usual after the first 24 hours. Do not soak the wound in water until the sutures are removed.   Get your sutures removed as directed by your health care provider. With facial lacerations, sutures should usually be taken out after 4-5 days to avoid stitch marks.   Wait a few days after your sutures are removed before applying any makeup. For Skin Adhesive Strips:  Keep the wound clean and dry.   Do not get the skin adhesive strips wet. You may bathe carefully, using caution to keep the wound dry.   If the wound gets wet, pat it dry with a clean towel.   Skin adhesive strips will fall off on their own. You may trim the strips as the wound heals. Do not remove skin adhesive strips that are still stuck to the wound. They will fall off in time.  For Wound Adhesive:  You may briefly wet your wound in the shower or bath. Do not soak or scrub the wound. Do not swim. Avoid  periods of heavy sweating until the skin adhesive has fallen off on its own. After showering or bathing, gently pat the wound dry with a clean towel.   Do not apply liquid medicine, cream medicine, ointment medicine, or makeup to your wound while the skin adhesive is in place. This may loosen the film before your wound is healed.   If a dressing is placed over the wound, be careful not to apply tape directly over the skin adhesive. This may cause the adhesive to be pulled off before the wound is healed.   Avoid prolonged exposure to sunlight or tanning lamps while the skin adhesive is in place.  The skin adhesive will usually remain in place for 5-10 days, then naturally fall off the skin. Do not pick at the adhesive film.  After Healing: Once the wound has healed, cover the wound with sunscreen during the day for 1 full year. This can help minimize scarring. Exposure to ultraviolet light in the first year will darken the scar. It can take 1-2 years for the scar to lose its redness and to heal completely.  SEEK IMMEDIATE MEDICAL CARE IF:  You have redness, pain, or swelling around the wound.   You see ayellowish-white fluid (pus) coming from the wound.   You have chills or a fever.  MAKE SURE YOU:  Understand these instructions.  Will watch your condition.  Will get help right away if you are not doing well or get worse. Document Released: 07/25/2004 Document Revised: 04/07/2013 Document Reviewed: 01/28/2013 [facility] Patient Information 2015 [place], [place]. This information is not intended to replace advice given to you by your health care provider. Make sure you discuss any questions you have with your health care provider.

## 2015-07-11 ENCOUNTER — Other Ambulatory Visit: Payer: Self-pay | Admitting: Neurology

## 2015-07-12 ENCOUNTER — Other Ambulatory Visit: Payer: Self-pay

## 2015-07-12 ENCOUNTER — Other Ambulatory Visit: Payer: Self-pay | Admitting: Neurology

## 2015-08-10 ENCOUNTER — Ambulatory Visit: Payer: BLUE CROSS/BLUE SHIELD | Attending: Neurology | Admitting: Physical Therapy

## 2015-08-10 DIAGNOSIS — Z7409 Other reduced mobility: Secondary | ICD-10-CM | POA: Diagnosis present

## 2015-08-10 DIAGNOSIS — M25669 Stiffness of unspecified knee, not elsewhere classified: Secondary | ICD-10-CM

## 2015-08-10 DIAGNOSIS — R531 Weakness: Secondary | ICD-10-CM | POA: Diagnosis present

## 2015-08-10 DIAGNOSIS — M24669 Ankylosis, unspecified knee: Secondary | ICD-10-CM | POA: Diagnosis present

## 2015-08-10 DIAGNOSIS — G1221 Amyotrophic lateral sclerosis: Secondary | ICD-10-CM | POA: Insufficient documentation

## 2015-08-10 NOTE — Patient Instructions (Addendum)
CAREGIVER ASSISTED: Hamstrings - Supine    Caregiver holds leg at ankle and over knee; raises leg straight. Hold __1-2_ minutes. _2-3__ reps per set, _1-2__ sets per day, _7__ days per week   Copyright  VHI. All rights reserved.    ROM: Plantar / Dorsiflexion    With left leg relaxed, gently flex and extend ankle. Move through full range of motion.  Repeat __15-20__ times per set. Do _1___ sets per session. Do __1-2__ sessions per day.  http://orth.exer.us/34   Copyright  VHI. All rights reserved.   KNEE: Extension, Long Arc Quads - Sitting    Raise leg until knee is straight. __15-20_ reps per set, _1-2 sets per day, __7_ days per week  Copyright  VHI. All rights reserved.   Abduction / Adduction    Feet hip width apart, spread thighs out. Repeat _15-20__ times each direction. Do _1-2__ sessions per day.   Copyright  VHI. All rights reserved.   Isometric Hip Adduction    With towel rolled between knees, press thighs together. Hold ____ seconds while counting out loud. Repeat ____ times. Do ____ sessions per day.  http://gt2.exer.us/629   Copyright  VHI. All rights reserved.   FLEXION: Sitting (Active)    Sit, both feet flat. Lift right knee toward ceiling.  Complete __1_ sets of _15-20__ repetitions. Perform _1-2__ sessions per day.  Copyright  VHI. All rights reserved.    *YOU CAN ALSO PROP HEEL UP ON SMALLER STEP (TURN TRASH CAN OVER) AND HOLD FOR UP TO 10 MINUTES*

## 2015-08-10 NOTE — Therapy (Signed)
[facility] Outpatient Rehabilitation [facility] [address]  [address] [place], [place], [address] Phone: [phone]   Fax:  [phone]  Physical Therapy Evaluation  Patient Details  Name: Steven Burns MRN: [id number] Date of Birth: Oct 08, 1960 Referring Provider: Yvonna Alanis, MD  Encounter Date: 08/10/2015      PT End of Session - 08/10/15 1108    Visit Number 1   Number of Visits 6   Date for PT Re-Evaluation 09/07/15   Authorization Type BC[employer]; 60 visit limit   PT Start Time 0845   PT Stop Time 0931   PT Time Calculation (min) 46 min   Activity Tolerance Patient tolerated treatment well   Behavior During Therapy [facility] for tasks assessed/performed      Past Medical History  Diagnosis Date  . Left arm weakness   . Left hand weakness   . Left leg weakness   . ALS (amyotrophic lateral sclerosis)   . Kidney stone     Past Surgical History  Procedure Laterality Date  . Appendectomy    . Kidney stone surgery      There were no vitals filed for this visit.  Visit Diagnosis:  ALS (amyotrophic lateral sclerosis) (HCC) - Plan: PT plan of care cert/re-cert  Weakness generalized - Plan: PT plan of care cert/re-cert  Decreased range of motion (ROM) of knee - Plan: PT plan of care cert/re-cert  Impaired mobility and ADLs - Plan: PT plan of care cert/re-cert      Subjective Assessment - 08/10/15 0849    Subjective Pt. is a 55 y/o male who presents to OPPT with diagnosis of ALS.  Pt states LUE/LLE most affected, but has tightness in bil hamstrings.  Pt states he feels R side is now showing some signs of atrophy.  Pt saw PT at [facility] 07/25/15 and recommended exercise program.   Pertinent History ALS   Patient Stated Goals increase ROM in LUE; try to improve strength in LLE and hamstring stretches while sitting in chair.     Currently in Pain? Yes   Pain Score 4    Pain Location Leg  hamstring just proximal to knee   Pain Orientation Upper;Posterior    Pain Descriptors / Indicators Tightness   Pain Type Chronic pain   Pain Onset More than a month ago   Pain Frequency Intermittent   Aggravating Factors  sitting   Pain Relieving Factors stretches (prolonged static hold in sitting)            [facility] PT Assessment - 08/10/15 0853    Assessment   Medical Diagnosis ALS   Referring Provider Yvonna Alanis, MD   Onset Date/Surgical Date --  Oct 2015   Hand Dominance Left  "now I'm right handed"   Next MD Visit 10/09/15   Prior Therapy "check-in" visits at ALS clinic in [place]   Precautions   Precautions Fall   Restrictions   Weight Bearing Restrictions No   Balance Screen   Has the patient fallen in the past 6 months Yes   How many times? 4   Has the patient had a decrease in activity level because of a fear of falling?  Yes   Is the patient reluctant to leave their home because of a fear of falling?  No  currently in power w/c   Home Environment   Living Environment Private residence   Living Arrangements Spouse/significant other   Type of Home House   Home Access Ramped entrance  Home Layout Able to live on main level with bedroom/bathroom   Prior Function   Level of Independence Needs assistance with ADLs;Independent with homemaking with wheelchair   Level of Independence - Bath Moderate   Toileting Minimal   Dressing Minimal  able to undress independently   Vocation Full time employment   Medical illustrator   Leisure "just lay out" watch basketball games; visit with friends   AROM   AROM Assessment Site Knee   Right Knee Extension 18   Left Knee Extension 15   PROM   PROM Assessment Site Knee   Right Knee Extension 12   Left Knee Extension 12   Strength   Overall Strength Comments all tested in sitting   Strength Assessment Site Shoulder;Elbow;Hip;Knee;Ankle   Right Shoulder Flexion 3-/5   Right Shoulder ABduction 3-/5   Left Shoulder Flexion 1/5   Left Shoulder ABduction 1/5   Right Elbow  Flexion 4+/5   Right Elbow Extension 4/5   Left Elbow Flexion 4/5   Left Elbow Extension 3/5   Right Hip Flexion 3+/5   Right Hip ABduction 3+/5   Left Hip Flexion 3+/5   Left Hip ABduction 3/5   Right/Left Knee Right;Left   Right Knee Flexion 4/5   Right Knee Extension 4/5   Left Knee Flexion 2/5   Left Knee Extension 3-/5   Right Ankle Dorsiflexion 3+/5   Left Ankle Dorsiflexion 3-/5   Flexibility   Soft Tissue Assessment /Muscle Length yes   Hamstrings tightness bil   Transfers   Comments not performed; pt states he is able to perform stand pivot transfers with RW with supervision                   [facility] Adult PT Treatment/Exercise - 08/10/15 0853    Self-Care   Self-Care Other Self-Care Comments   Other Self-Care Comments  pt instructed in seated exercises he can perform independently and supine hamstring stretch to be performed with wife   Exercises   Exercises Knee/Hip   Knee/Hip Exercises: Stretches   Passive Hamstring Stretch Right;1 rep;Other (comment)  3 min   Knee/Hip Exercises: Seated   Long Arc Quad Both;5 reps   [employer] 5x5sec   Marching Both;5 reps   Abduction/Adduction  Both;5 reps                PT Education - 08/10/15 1104    Education provided Yes   Education Details HEP; and goals and plan of care   Person(s) Educated Patient   Methods Explanation;Handout   Comprehension Verbalized understanding             PT Long Term Goals - 08/10/15 1120    PT LONG TERM GOAL #1   Title pt and caregiver to demonstrate independence with HEP (09/07/15)   Time 4   Period Weeks   Status New   PT LONG TERM GOAL #2   Title improve bil knee PROM extension to at least 5 degrees (from 0) for improved hamstring length and decreased pain (09/07/15)   Time 4   Period Weeks   Status New   PT LONG TERM GOAL #3   Title trial adaptive equipment to determine any appropriate needs to maximize independence with ADLs (09/07/15)   Time 4   Period  Weeks   Status New               Plan - 08/10/15 1109    Clinical Impression Statement Pt is  a 55 y/o male who presents to OPPT for moderate complexity evaluation of ALS affecting safe functional mobility.  Pt c/o bil hamstring tightness and increased weakness on L side with now onset of progressive weakness on R.  Pt here today to establish HEP to assist with deficits listed above and maximize as much independence as able.   Pt will benefit from skilled therapeutic intervention in order to improve on the following deficits Decreased strength;Decreased range of motion;Pain;Decreased endurance;Cardiopulmonary status limiting activity;Impaired flexibility;Impaired UE functional use   Rehab Potential Good   PT Frequency 2x / week  decreasing to 1x/wk after 2 weeks   PT Duration 4 weeks   PT Treatment/Interventions ADLs/Self Care Home Management;Moist Heat;Patient/family education;Neuromuscular re-education;Therapeutic exercise;Therapeutic activities;Functional mobility training;Orthotic Fit/Training;Passive range of motion;Energy conservation   PT Next Visit Plan review HEP and add UE exercises; give supine HEP and any caregiver assistive exercises for stretching; knee ext stretching; adaptive equipment trial   Consulted and Agree with Plan of Care Patient         Problem List Patient Active Problem List   Diagnosis Date Noted  . 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 Atrophy of the muscles 05/04/2014  . Fasciculations 05/04/2014  . Left arm weakness   . Left hand weakness   . Left leg weakness    Clarita Crane, PT, DPT 08/10/2015 11:30 AM  [facility] [address]  [address] [place], [place], [address] Phone: [phone]   Fax:  [phone]  Name: Steven Burns MRN: [id number] Date of Birth: [birth year]

## 2015-08-15 ENCOUNTER — Ambulatory Visit: Payer: BLUE CROSS/BLUE SHIELD | Admitting: Physical Therapy

## 2015-08-15 DIAGNOSIS — G1221 Amyotrophic lateral sclerosis: Secondary | ICD-10-CM

## 2015-08-15 DIAGNOSIS — M25669 Stiffness of unspecified knee, not elsewhere classified: Secondary | ICD-10-CM

## 2015-08-15 DIAGNOSIS — R531 Weakness: Secondary | ICD-10-CM

## 2015-08-15 DIAGNOSIS — Z7409 Other reduced mobility: Secondary | ICD-10-CM

## 2015-08-15 NOTE — Therapy (Signed)
[facility] Outpatient Rehabilitation [facility] [address]  [address] [place], [place], [address] Phone: [phone]   Fax:  [phone]  Physical Therapy Treatment  Patient Details  Name: Steven Burns MRN: [id number] Date of Birth: 11-12-1960 Referring Provider: Yvonna Alanis, MD  Encounter Date: 08/15/2015      PT End of Session - 08/15/15 1132    Visit Number 2   Number of Visits 6   Date for PT Re-Evaluation 09/07/15   Authorization Type BCBS; 60 visit limit   PT Start Time 1023   PT Stop Time 1110   PT Time Calculation (min) 47 min   Activity Tolerance Patient tolerated treatment well   Behavior During Therapy [facility] for tasks assessed/performed      Past Medical History  Diagnosis Date  . Left arm weakness   . Left hand weakness   . Left leg weakness   . ALS (amyotrophic lateral sclerosis)   . Kidney stone     Past Surgical History  Procedure Laterality Date  . Appendectomy    . Kidney stone surgery      There were no vitals filed for this visit.  Visit Diagnosis:  ALS (amyotrophic lateral sclerosis) (HCC)  Weakness generalized  Decreased range of motion (ROM) of knee  Impaired mobility and ADLs      Subjective Assessment - 08/15/15 1022    Subjective Did exercises over weekend, no new complaints.   Patient Stated Goals increase ROM in LUE; try to improve strength in LLE and hamstring stretches while sitting in chair.     Currently in Pain? No/denies                         [facility] Adult PT Treatment/Exercise - 08/15/15 1112    Self-Care   Self-Care ADL's   ADL's instructed in use of button   Other Self-Care Comments  educated in use of button hook and sock aide.  Increased time needed but pt able to perform with set up only for sock aide and independently with button hook.   Exercises   Exercises Shoulder;Elbow;Hand   Elbow Exercises   Elbow Flexion Both;10 reps;Seated   Knee/Hip Exercises: Seated   Long  Arc Quad Both;10 reps   Ball Squeeze 10x5 sec   Other Seated Knee/Hip Exercises ankle pumps x 10 bil   Marching Both;10 reps   Abduction/Adduction  Both;10 reps   Shoulder Exercises: Seated   Horizontal ABduction Both;10 reps   Horizontal ABduction Limitations with cane at ~ 30 degrees shoulder flexion   External Rotation AAROM;Both;10 reps   External Rotation Limitations with cane   Flexion Both;10 reps   Flexion Limitations with cane; 0- ~50 degrees   Hand Exercises   Other Hand Exercises towel squeeze 10 x 5 sec bil   Manual Therapy   Manual Therapy Passive ROM   Passive ROM bil shoulder flexion/abduction and bil elbow flexion/extension seated in chair                PT Education - 08/15/15 1132    Education provided Yes   Education Details UE HEP   Person(s) Educated Patient;Spouse   Methods Explanation;Demonstration;Handout   Comprehension Verbalized understanding;Returned demonstration             PT Long Term Goals - 08/10/15 1120    PT LONG TERM GOAL #1   Title pt and caregiver to demonstrate independence with HEP (09/07/15)   Time 4  Period Weeks   Status New   PT LONG TERM GOAL #2   Title improve bil knee PROM extension to at least 5 degrees (from 0) for improved hamstring length and decreased pain (09/07/15)   Time 4   Period Weeks   Status New   PT LONG TERM GOAL #3   Title trial adaptive equipment to determine any appropriate needs to maximize independence with ADLs (09/07/15)   Time 4   Period Weeks   Status New               Plan - 08/15/15 1135    Clinical Impression Statement Pt demonstrated lower extremity HEP independently; and added UE active motion HEP to maintain current strength.  Pt also reports decreasing ROM in UEs affecting dressing.  Pt modified independent with button hook and needed set up only for sock aide; reports he will look into button hook to purchase for home use.   Pt will benefit from skilled therapeutic  intervention in order to improve on the following deficits Decreased strength;Decreased range of motion;Pain;Decreased endurance;Cardiopulmonary status limiting activity;Impaired flexibility;Impaired UE functional use   PT Next Visit Plan ROM UEs; stretches, AA, passive   Consulted and Agree with Plan of Care Patient        Problem List Patient Active Problem List   Diagnosis Date Noted  . 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 Atrophy of the muscles 05/04/2014  . Fasciculations 05/04/2014  . Left arm weakness   . Left hand weakness   . Left leg weakness    Clarita Crane, PT, DPT 08/15/2015 11:39 AM  [facility] [address]  [address] [place], [place], [address] Phone: [phone]   Fax:  [phone]  Name: Steven Burns MRN: [id number] Date of Birth: [birth year]

## 2015-08-15 NOTE — Patient Instructions (Signed)
Cane Overhead - Standing    With arms straight, hold cane forward at waist. Raise cane above head. Hold _2-3__ seconds. Repeat _10__ times. Do _1-2__ times per day.  Copyright  VHI. All rights reserved.    Cane Horizontal - Standing    Straight arms holding cane at shoulder height, bring cane to right, center, left. Repeat starting to left. Repeat __10_ times. Do _1-2__ times per day.  Copyright  VHI. All rights reserved.   AROM: Elbow Flexion / Extension    With left hand palm up, gently bend elbow as far as possible. Then straighten arm as far as possible.  Repeat with other hand. Repeat __10__ times per set. Do __1__ sets per session. Do __1-2__ sessions per day.  Copyright  VHI. All rights reserved.   SHOULDER: External Rotation - Supine (Cane)    Hold cane with both hands. Rotate arm away from body. Keep elbow on floor and next to body. __10_ reps per set, __1-2_ sets per day. Add towel to keep elbow at side.  Copyright  VHI. All rights reserved.    Towel Roll Squeeze    With right forearm resting on surface, gently squeeze towel. Repeat __10__ times per set. Do __1__ sets per session. Do __1-2__ sessions per day.  Copyright  VHI. All rights reserved.

## 2015-08-17 ENCOUNTER — Ambulatory Visit: Payer: BLUE CROSS/BLUE SHIELD | Admitting: Physical Therapy

## 2015-08-17 DIAGNOSIS — Z7409 Other reduced mobility: Secondary | ICD-10-CM

## 2015-08-17 DIAGNOSIS — R531 Weakness: Secondary | ICD-10-CM

## 2015-08-17 DIAGNOSIS — G1221 Amyotrophic lateral sclerosis: Secondary | ICD-10-CM

## 2015-08-17 DIAGNOSIS — M25669 Stiffness of unspecified knee, not elsewhere classified: Secondary | ICD-10-CM

## 2015-08-17 NOTE — Therapy (Signed)
[facility] [address]  [address] [place], [place], [address] Phone: [phone]   Fax:  [phone]  Physical Therapy Treatment  Patient Details  Name: Steven Burns MRN: [id number] Date of Birth: 03-Nov-1960 Referring Provider: Yvonna Alanis, MD  Encounter Date: 08/17/2015      PT End of Session - 08/17/15 1139    Visit Number 3   Number of Visits 6   Date for PT Re-Evaluation 09/07/15   Authorization Type BCBS; 60 visit limit   PT Start Time 1030   PT Stop Time 1115   PT Time Calculation (min) 45 min   Equipment Utilized During Treatment Gait belt   Activity Tolerance Patient tolerated treatment well   Behavior During Therapy [facility] for tasks assessed/performed      Past Medical History  Diagnosis Date  . Left arm weakness   . Left hand weakness   . Left leg weakness   . ALS (amyotrophic lateral sclerosis)   . Kidney stone     Past Surgical History  Procedure Laterality Date  . Appendectomy    . Kidney stone surgery      There were no vitals filed for this visit.  Visit Diagnosis:  ALS (amyotrophic lateral sclerosis) (HCC)  Weakness generalized  Decreased range of motion (ROM) of knee  Impaired mobility and ADLs      Subjective Assessment - 08/17/15 1129    Subjective Legs are feeling good; arms were sore after last session. "They haven't done anything in a long time."   Patient is accompained by: Family member   Pertinent History ALS   Patient Stated Goals increase ROM in LUE; try to improve strength in LLE and hamstring stretches while sitting in chair.     Currently in Pain? No/denies                         [facility] Adult PT Treatment/Exercise - 08/17/15 1129    Bed Mobility   Bed Mobility Supine to Sit;Sit to Supine   Supine to Sit 2: Max assist   Sit to Supine 4: Min assist   Sit to Supine - Details (indicate cue type and reason) for LEs only   Transfers   Transfers Sit  to Stand;Stand to Sit;Stand Pivot Transfers   Sit to Stand 4: Min assist;3: Mod assist   Sit to Stand Details Tactile cues for weight shifting;Verbal cues for sequencing   Sit to Stand Details (indicate cue type and reason) mod A x 3 as pt with posterior lean needing increased A to shift weight anteriorly over feet; min A x 2 with improved forward weight shift   Stand to Sit 4: Min assist   Stand to Sit Details to control descent   Stand Pivot Transfers 4: Min Wellsite geologist Details (indicate cue type and reason) wiith RW   Self-Care   Self-Care ADL's   ADL's educated in other button tools that may be easier for pt; and 3-in-1 options to assist with toileting   Elbow Exercises   Elbow Extension PROM;Left;Supine   Elbow Extension Limitations 3x30 sec hold   Shoulder Exercises: Supine   External Rotation PROM;Both;Limitations   External Rotation Limitations 3x30 sec hold   Flexion Both;PROM   Flexion Limitations 3x30 sec hold   ABduction PROM;Both   ABduction Limitations 3x30 sec hold                PT  Education - 08/17/15 1139    Education provided Yes   Education Details button hook options; 3-in-1 and UE stretching HEP   Person(s) Educated Patient;Spouse   Methods Explanation;Demonstration;Handout   Comprehension Verbalized understanding;Returned demonstration;Need further instruction             PT Long Term Goals - 08/17/15 1143    PT LONG TERM GOAL #1   Title pt and caregiver to demonstrate independence with HEP (09/07/15)   Status On-going   PT LONG TERM GOAL #2   Title improve bil knee PROM extension to at least 5 degrees (from 0) for improved hamstring length and decreased pain (09/07/15)   Status On-going   PT LONG TERM GOAL #3   Title trial adaptive equipment to determine any appropriate needs to maximize independence with ADLs (09/07/15)   Status On-going               Plan - 08/17/15 1140    Clinical Impression Statement Pt and  wife instructed in proper sit to stand technique to decrease caregiver burden and pt able to perform needing only min A from wife.  Initiated stretching HEP for UEs to maintain current ROM and improve if able.   PT Next Visit Plan review stretches and UE HEP; address any other deficits/concerns   Consulted and Agree with Plan of Care Patient;Family member/caregiver   Family Member Consulted wife        Problem List Patient Active Problem List   Diagnosis Date Noted  . 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 Atrophy of the muscles 05/04/2014  . Fasciculations 05/04/2014  . Left arm weakness   . Left hand weakness   . Left leg weakness    Clarita Crane, PT, DPT 08/17/2015 11:44 AM  [facility] [address]  [address] [place], [place], [address] Phone: [phone]   Fax:  [phone]  Name: Steven Burns MRN: [id number] Date of Birth: Oct 31, 1960

## 2015-08-17 NOTE — Patient Instructions (Addendum)
    Abduction: ROM (Supine)    Position (A) Helper: Hold left arm at elbow and wrist. Elbow may be bent or straight. Motion (B) -Glide arm out to side. -Do not allow arm to go beyond shoulder level. CAUTION: Do not push into shoulder joint. Hold 20-30 seconds.  Repeat __3-5_ times. Repeat with other arm. Do _1__ sessions per day.   Copyright  VHI. All rights reserved.    External Rotation In Flexion: ROM (Supine)    Position (A) Helper: Hold left arm upright, elbow bent to 90. Motion (B) - Rotate arm so hand moves toward patient's head. - Elbow remains in place, wrist straight. CAUTION: Stop at point of tension in muscle or joint. Hold 20-30 seconds. Repeat _3-5__ times. Repeat with other arm. Do _1__ sessions per day.   Copyright  VHI. All rights reserved.    Flexion: ROM (Supine)    Position (A) Helper: Hold left arm close to side of trunk. Motion (B) - Lift arm over head in line with trunk, palm turned inward. CAUTION: Do not push into shoulder joint. Do not force movement if painful. Hold 20-30 seconds.  Repeat _3-5__ times. Repeat with other arm. Do __1_ sessions per day.   Copyright  VHI. All rights reserved.    Extension: ROM (Sitting)    Position Helper: Place one hand under left elbow to stabilize. Motion -Straighten elbow fully. -Helper assists by using gentle downward pull. CAUTION: Do not force elbow joint. Hold 20-30 seconds.  Repeat _3-5__ times. Repeat with other arm. Do __1_ sessions per day.  Copyright  VHI. All rights reserved.

## 2015-08-22 ENCOUNTER — Ambulatory Visit: Payer: BLUE CROSS/BLUE SHIELD | Admitting: Physical Therapy

## 2015-08-22 DIAGNOSIS — G1221 Amyotrophic lateral sclerosis: Secondary | ICD-10-CM | POA: Diagnosis not present

## 2015-08-22 DIAGNOSIS — R531 Weakness: Secondary | ICD-10-CM

## 2015-08-22 DIAGNOSIS — M25669 Stiffness of unspecified knee, not elsewhere classified: Secondary | ICD-10-CM

## 2015-08-22 DIAGNOSIS — Z7409 Other reduced mobility: Secondary | ICD-10-CM

## 2015-08-22 NOTE — Patient Instructions (Addendum)
Flexion: Stretch - Hamstrings (Supine)    Position (A) Helper: Stabilize hip. Place other hand under left ankle. Motion (B) -Helper lifts leg, keeping knee straight and foot pointing in direction of movement. -Stop at point of tension in back of thigh. -Do not allow patient's pelvis to rise off bed. CAUTION: Do not allow knee to go beyond straight. Hold _20-30__ seconds. Repeat _3-5__ times. Repeat with other leg. Do _1__ sessions per day.   Copyright  VHI. All rights reserved.  Dorsiflexion: Stretch - Heel Cord / Gastrocnemius    Position (A) Patient: Lie or sit with knee straight. Helper: Cup left heel. Make sure grip is firm. Motion (B) - Helper uses forearm to apply pressure to entire sole of foot, stretching foot toward shin. CAUTION: Stretch should be felt in calf. Do not allow foot to twist. Hold _20-30__ seconds. Repeat _3-5__ times. Repeat with other leg. Do _1__ sessions per day.   Copyright  VHI. All rights reserved.

## 2015-08-22 NOTE — Therapy (Signed)
[facility] Outpatient Rehabilitation [facility] [address]  [address] [place], [place], [address] Phone: [phone]   Fax:  [phone]  Physical Therapy Treatment  Patient Details  Name: Steven Burns MRN: [id number] Date of Birth: 1961-05-16 Referring Provider: Yvonna Alanis, MD  Encounter Date: 08/22/2015      PT End of Session - 08/22/15 1612    Visit Number 4   Number of Visits 6   Date for PT Re-Evaluation 09/07/15   Authorization Type BCBS; 60 visit limit   PT Start Time 1530   PT Stop Time 1611   PT Time Calculation (min) 41 min   Activity Tolerance Patient tolerated treatment well   Behavior During Therapy [facility] for tasks assessed/performed      Past Medical History  Diagnosis Date  . Left arm weakness   . Left hand weakness   . Left leg weakness   . ALS (amyotrophic lateral sclerosis)   . Kidney stone     Past Surgical History  Procedure Laterality Date  . Appendectomy    . Kidney stone surgery      There were no vitals filed for this visit.  Visit Diagnosis:  ALS (amyotrophic lateral sclerosis) (HCC)  Weakness generalized  Decreased range of motion (ROM) of knee  Impaired mobility and ADLs      Subjective Assessment - 08/22/15 1552    Subjective Open to having [employer] student help with home stretches/exercises   Patient Stated Goals increase ROM in LUE; try to improve strength in LLE and hamstring stretches while sitting in chair.     Currently in Pain? No/denies                         [facility] Adult PT Treatment/Exercise - 08/22/15 1552    Knee/Hip Exercises: Stretches   Passive Hamstring Stretch Both;3 reps;30 seconds   Passive Hamstring Stretch Limitations manual   Gastroc Stretch Both;3 reps;30 seconds   Gastroc Stretch Limitations manual   Shoulder Exercises: Supine   External Rotation PROM;Both;Limitations   External Rotation Limitations 3x30 sec hold   Flexion Both;PROM   Flexion Limitations 3x30  sec hold   ABduction PROM;Both   ABduction Limitations 3x30 sec hold                PT Education - 08/22/15 1611    Education provided Yes   Education Details LE stretching HEP   Person(s) Educated Patient;Spouse   Methods Explanation;Demonstration;Handout   Comprehension Verbalized understanding;Returned demonstration;Need further instruction             PT Long Term Goals - 08/17/15 1143    PT LONG TERM GOAL #1   Title pt and caregiver to demonstrate independence with HEP (09/07/15)   Status On-going   PT LONG TERM GOAL #2   Title improve bil knee PROM extension to at least 5 degrees (from 0) for improved hamstring length and decreased pain (09/07/15)   Status On-going   PT LONG TERM GOAL #3   Title trial adaptive equipment to determine any appropriate needs to maximize independence with ADLs (09/07/15)   Status On-going               Plan - 08/22/15 1612    Clinical Impression Statement Provided pt and wife with LE stretching HEP; and they are open to [facility] student to assist with stretching.  Pt's wife reports sit to stand improved with cues from last session.   PT Next  Visit Plan review LE stretches; discuss any remaining concerns   Consulted and Agree with Plan of Care Patient;Family member/caregiver   Family Member Consulted wife        Problem List Patient Active Problem List   Diagnosis Date Noted  . 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 Atrophy of the muscles 05/04/2014  . Fasciculations 05/04/2014  . Left arm weakness   . Left hand weakness   . Left leg weakness    Clarita Crane, PT, DPT 08/22/2015 4:15 PM  [facility] Outpatient Rehabilitation [facility] [address]  [address] [place], [place], [address] Phone: [phone]   Fax:  [phone]  Name: Vinayak Bobier MRN: [id number] Date of Birth: July 19, 1960

## 2015-08-23 ENCOUNTER — Ambulatory Visit: Payer: BLUE CROSS/BLUE SHIELD | Admitting: Physical Therapy

## 2015-08-23 DIAGNOSIS — R531 Weakness: Secondary | ICD-10-CM

## 2015-08-23 DIAGNOSIS — G1221 Amyotrophic lateral sclerosis: Secondary | ICD-10-CM | POA: Diagnosis not present

## 2015-08-23 DIAGNOSIS — Z7409 Other reduced mobility: Secondary | ICD-10-CM

## 2015-08-23 DIAGNOSIS — M25669 Stiffness of unspecified knee, not elsewhere classified: Secondary | ICD-10-CM

## 2015-08-23 NOTE — Therapy (Addendum)
[facility] [address]  [place], [place], [address] Phone: [phone]   Fax:  [phone]  Physical Therapy Treatment  Patient Details  Name: Steven Burns MRN: [id number] Date of Birth: Aug 17, 1960 Referring Provider: Franchot Mimes, MD  Encounter Date: 08/23/2015      PT End of Session - 08/23/15 0857    Visit Number 5   Number of Visits 6   Date for PT Re-Evaluation 09/07/15   Authorization Type BCBS; 60 visit limit   PT Start Time 0849   PT Stop Time 0914   PT Time Calculation (min) 25 min   Activity Tolerance Patient tolerated treatment well   Behavior During Therapy [facility] for tasks assessed/performed      Past Medical History  Diagnosis Date  . Left arm weakness   . Left hand weakness   . Left leg weakness   . ALS (amyotrophic lateral sclerosis)   . Kidney stone     Past Surgical History  Procedure Laterality Date  . Appendectomy    . Kidney stone surgery      There were no vitals filed for this visit.  Visit Diagnosis:  ALS (amyotrophic lateral sclerosis) (HCC)  Weakness generalized  Decreased range of motion (ROM) of knee  Impaired mobility and ADLs      Subjective Assessment - 08/23/15 0856    Subjective Patient reports wife had a meeting this morning and was unable to join him for therapy. Would like to review cane assisted UE ROM/stretches.   Currently in Pain? No/denies                         [facility] Adult PT Treatment/Exercise - 08/23/15 0001    Knee/Hip Exercises: Stretches   Other Knee/Hip Stretches Hooklying Hip Adducitor "butterfly" stretch (verbal and visual instruction but pt opted not to attempt)   Shoulder Exercises: Seated   Horizontal ABduction Both;10 reps   Horizontal ABduction Limitations with cane at ~ 30 degrees shoulder flexion   External Rotation AAROM;Both;10 reps   External Rotation Limitations with cane   Flexion Both;10 reps   Flexion  Limitations with cane; 0- ~50 degrees                PT Education - 08/23/15 0924    Education provided Yes   Education Details Hip Adductor stretch   Person(s) Educated Patient   Methods Explanation;Demonstration;Handout   Comprehension Verbalized understanding             PT Long Term Goals - 08/23/15 8309    PT LONG TERM GOAL #1   Title pt and caregiver to demonstrate independence with HEP (09/07/15)   Status On-going   PT LONG TERM GOAL #2   Title improve bil knee PROM extension to at least 5 degrees (from 0) for improved hamstring length and decreased pain (09/07/15)   Status On-going   PT LONG TERM GOAL #3   Title trial adaptive equipment to determine any appropriate needs to maximize independence with ADLs (09/07/15)   Status On-going      Goal #3 met; unable to assess goals #1 and 2 but anticipate goals met 08/31/15 sfm         Plan - 08/23/15 0916    Clinical Impression Statement Patient's wife unable to attend PT session today, but pt reports he feels comfortable with all of the activities and stretches with which she has been helping. Patient wanting to  review cane AAROM for UE's with pt able to demonstrate all exercises appropriately. Fatigue noted with shoulder flexion AAROM, therefore pt instructed in alternative hold pattern for cane with cane vertical and R UE raising L arm through shoudler flexion with patient able to perform return demonstration. Pt instructed in hooklying hip adductor stretch with patient verbalizing understanding.        Problem List Patient Active Problem List   Diagnosis Date Noted  . Motor neuron disease ([place]) 05/17/2014  . Atrophy of the muscles 05/04/2014  . Fasciculations 05/04/2014  . Left arm weakness   . Left hand weakness   . Left leg weakness     Percival Spanish, PT, MPT 08/23/2015, 9:26 AM  [facility] [address]  [place], [place],  [address] Phone: [phone]   Fax:  [phone]  Name: Steven Burns MRN: [id number] Date of Birth: 04-13-1961     PHYSICAL THERAPY DISCHARGE SUMMARY  Visits from Start of Care: 5  Current functional level related to goals / functional outcomes: See above   Remaining deficits: Weakness, tightness and decreased functional mobility consistent with progressive neurological condition   Education / Equipment: HEP, adaptive equipment, pt/wife to pursue in home assistance with potential [employer] PT student Plan: Patient agrees to discharge.  Patient goals were partially met. Patient is being discharged due to being pleased with the current functional level.  ?????   Laureen Abrahams, PT, DPT 08/31/2015 9:52 AM  [facility] [place],  [address]  [phone] (office) [phone] (fax)

## 2015-08-29 ENCOUNTER — Ambulatory Visit: Payer: BLUE CROSS/BLUE SHIELD

## 2015-09-05 ENCOUNTER — Ambulatory Visit: Payer: BLUE CROSS/BLUE SHIELD | Admitting: Physical Therapy

## 2015-10-19 ENCOUNTER — Ambulatory Visit: Payer: BLUE CROSS/BLUE SHIELD

## 2015-10-26 ENCOUNTER — Ambulatory Visit: Payer: BLUE CROSS/BLUE SHIELD | Attending: Neurology | Admitting: Physical Therapy

## 2015-10-26 ENCOUNTER — Ambulatory Visit: Payer: BLUE CROSS/BLUE SHIELD

## 2015-10-26 DIAGNOSIS — M6281 Muscle weakness (generalized): Secondary | ICD-10-CM

## 2015-10-26 DIAGNOSIS — R2689 Other abnormalities of gait and mobility: Secondary | ICD-10-CM | POA: Insufficient documentation

## 2015-10-26 NOTE — Therapy (Addendum)
[facility] [address]  [place], [place], [address] Phone: [phone]   Fax:  [phone]  Physical Therapy Treatment/Recertification  Patient Details  Name: Steven Burns MRN: [id number] Date of Birth: [birth year] Referring Provider: Franchot Mimes, MD  Encounter Date: 10/26/2015      PT End of Session - 10/26/15 1518    Visit Number 6   Date for PT Re-Evaluation 11/25/15   Authorization Type BCBS; 60 visit limit   PT Start Time 1345   PT Stop Time 1500   PT Time Calculation (min) 75 min   Activity Tolerance Patient tolerated treatment well   Behavior During Therapy [facility] for tasks assessed/performed      Past Medical History  Diagnosis Date  . Left arm weakness   . Left hand weakness   . Left leg weakness   . ALS (amyotrophic lateral sclerosis)   . Kidney stone     Past Surgical History  Procedure Laterality Date  . Appendectomy    . Kidney stone surgery      There were no vitals filed for this visit.      Subjective Assessment - 10/26/15 1516    Subjective here today to review exercises and show volunteers how to perform   Patient is accompained by: Family member  in home volunteers; Steven Burns, PT, DPT (professor at [employer])   Patient Stated Goals increase ROM in LUE; try to improve strength in LLE and hamstring stretches while sitting in chair.     Currently in Pain? No/denies         Self Care: -Session focused today on caregiver education of student volunteers to assist with HEP.  HEP performed and reviewed.  Educated volunteers and they returned/demonstrated proper technique of all exercises.  Recommended volunteers assist with HEP 2-3x/wk as able.  Pt/caregivers in agreement.                        PT Education - 10/26/15 1518    Education provided Yes   Education Details HEP and proper techniques for stretches   Person(s) Educated Patient;Spouse;Caregiver(s)   Methods Explanation;Demonstration;Handout   Comprehension Verbalized understanding;Returned demonstration             PT Long Term Goals - 08/23/15 0924    PT LONG TERM GOAL #1   Title pt and caregiver to demonstrate independence with HEP (09/07/15)   Status On-going   PT LONG TERM GOAL #2   Title improve bil knee PROM extension to at least 5 degrees (from 0) for improved hamstring length and decreased pain (09/07/15)   Status On-going   PT LONG TERM GOAL #3   Title trial adaptive equipment to determine any appropriate needs to maximize independence with ADLs (09/07/15)   Status On-going               Plan - 10/26/15 1518    Clinical Impression Statement Pt returned to PT today for caregiver education session.  With assistance of Steven Burns, PT, DPT and professor at [employer] volunteers/students were willing to assist pt with home program at home 1-2x/wk.  Educated volunteers on correct technique to assist with stretching and ROM exercises.  All verbalized understanding.  At this time; no further PT needs identified but will plan to follow up PRN.   PT Frequency Other (comment)  PRN   PT Treatment/Interventions ADLs/Self Care Home Management;Moist Heat;Patient/family education;Neuromuscular re-education;Therapeutic exercise;Therapeutic activities;Functional mobility training;Orthotic Fit/Training;Passive  range of motion;Energy conservation   PT Next Visit Plan to follow up PRN   Consulted and Agree with Plan of Care Patient;Family member/caregiver   Family Member Consulted wife, student volunteers      Patient will benefit from skilled therapeutic intervention in order to improve the following deficits and impairments:  Decreased strength, Decreased range of motion, Pain, Decreased endurance, Cardiopulmonary status limiting activity, Impaired flexibility, Impaired UE functional use  Visit Diagnosis: Muscle weakness (generalized) - Plan: PT plan of care cert/re-cert  Other  abnormalities of gait and mobility - Plan: PT plan of care cert/re-cert     Problem List Patient Active Problem List   Diagnosis Date Noted  . Motor neuron disease ([place]) 05/17/2014  . Atrophy of the muscles 05/04/2014  . Fasciculations 05/04/2014  . Left arm weakness   . Left hand weakness   . Left leg weakness    Laureen Abrahams, PT, DPT 10/26/2015 3:24 PM  [facility] [address]  [place], [place], [address] Phone: [phone]   Fax:  [phone]  Name: Steven Burns MRN: [id number] Date of Birth: January 12, 1961       PHYSICAL THERAPY DISCHARGE SUMMARY  Visits from Start of Care: 6  Current functional level related to goals / functional outcomes: See above   Remaining deficits: Declining function consistent with progressive neurological disease   Education / Equipment: HEP, equipment, set up student volunteers to help with HEP  Plan: Patient agrees to discharge.  Patient goals were not met. Patient is being discharged due to not returning since the last visit.  ?????     Laureen Abrahams, PT, DPT 05/28/16 8:13 AM   [facility] [place], [place] [address]  [phone] (office) [phone] (fax)

## 2016-03-13 ENCOUNTER — Emergency Department (HOSPITAL_BASED_OUTPATIENT_CLINIC_OR_DEPARTMENT_OTHER): Payer: BLUE CROSS/BLUE SHIELD

## 2016-03-13 ENCOUNTER — Encounter (HOSPITAL_BASED_OUTPATIENT_CLINIC_OR_DEPARTMENT_OTHER): Payer: Self-pay | Admitting: *Deleted

## 2016-03-13 ENCOUNTER — Emergency Department (HOSPITAL_BASED_OUTPATIENT_CLINIC_OR_DEPARTMENT_OTHER)
Admission: EM | Admit: 2016-03-13 | Discharge: 2016-03-14 | Disposition: A | Discharge status: Other Acute Inpatient Hospital | Payer: BLUE CROSS/BLUE SHIELD | Attending: Emergency Medicine | Admitting: Emergency Medicine

## 2016-03-13 DIAGNOSIS — R05 Cough: Secondary | ICD-10-CM | POA: Diagnosis present

## 2016-03-13 DIAGNOSIS — A419 Sepsis, unspecified organism: Secondary | ICD-10-CM | POA: Insufficient documentation

## 2016-03-13 DIAGNOSIS — G1221 Amyotrophic lateral sclerosis: Secondary | ICD-10-CM | POA: Insufficient documentation

## 2016-03-13 DIAGNOSIS — N390 Urinary tract infection, site not specified: Secondary | ICD-10-CM | POA: Insufficient documentation

## 2016-03-13 HISTORY — DX: Presence of other specified devices: Z97.8

## 2016-03-13 HISTORY — DX: Presence of urogenital implants: Z96.0

## 2016-03-13 LAB — CBC WITH DIFFERENTIAL/PLATELET
BASOS ABS: 0.1 10*3/uL (ref 0.0–0.1)
Basophils Relative: 0 %
EOS ABS: 0.1 10*3/uL (ref 0.0–0.7)
EOS PCT: 1 %
HCT: 45.9 % (ref 39.0–52.0)
Hemoglobin: 15.9 g/dL (ref 13.0–17.0)
Lymphocytes Relative: 10 %
Lymphs Abs: 2.1 10*3/uL (ref 0.7–4.0)
MCH: 31.7 pg (ref 26.0–34.0)
MCHC: 34.6 g/dL (ref 30.0–36.0)
MCV: 91.4 fL (ref 78.0–100.0)
MONO ABS: 1.6 10*3/uL — AB (ref 0.1–1.0)
Monocytes Relative: 8 %
Neutro Abs: 16.6 10*3/uL — ABNORMAL HIGH (ref 1.7–7.7)
Neutrophils Relative %: 81 %
Platelets: 304 10*3/uL (ref 150–400)
RBC: 5.02 MIL/uL (ref 4.22–5.81)
RDW: 12.9 % (ref 11.5–15.5)
WBC: 20.4 10*3/uL — ABNORMAL HIGH (ref 4.0–10.5)

## 2016-03-13 LAB — URINALYSIS, ROUTINE W REFLEX MICROSCOPIC
BILIRUBIN URINE: NEGATIVE
GLUCOSE, UA: NEGATIVE mg/dL
Ketones, ur: NEGATIVE mg/dL
Nitrite: POSITIVE — AB
PH: 5 (ref 5.0–8.0)
Protein, ur: 30 mg/dL — AB
SPECIFIC GRAVITY, URINE: 1.019 (ref 1.005–1.030)

## 2016-03-13 LAB — LIPASE, BLOOD: LIPASE: 27 U/L (ref 11–51)

## 2016-03-13 LAB — I-STAT CG4 LACTIC ACID, ED
Lactic Acid, Venous: 1.21 mmol/L (ref 0.5–1.9)
Lactic Acid, Venous: 2.41 mmol/L (ref 0.5–1.9)

## 2016-03-13 LAB — URINE MICROSCOPIC-ADD ON

## 2016-03-13 LAB — COMPREHENSIVE METABOLIC PANEL
ALT: 31 U/L (ref 17–63)
AST: 28 U/L (ref 15–41)
Albumin: 3.8 g/dL (ref 3.5–5.0)
Alkaline Phosphatase: 99 U/L (ref 38–126)
Anion gap: 8 (ref 5–15)
BILIRUBIN TOTAL: 1.2 mg/dL (ref 0.3–1.2)
BUN: 22 mg/dL — AB (ref 6–20)
CALCIUM: 9.6 mg/dL (ref 8.9–10.3)
CO2: 25 mmol/L (ref 22–32)
Chloride: 104 mmol/L (ref 101–111)
Creatinine, Ser: 0.73 mg/dL (ref 0.61–1.24)
GFR calc Af Amer: >60 mL/min (ref >60)
Glucose, Bld: 96 mg/dL (ref 65–99)
Potassium: 4.2 mmol/L (ref 3.5–5.1)
Sodium: 137 mmol/L (ref 135–145)
TOTAL PROTEIN: 7.7 g/dL (ref 6.5–8.1)

## 2016-03-13 MED ORDER — ACETAMINOPHEN 500 MG PO TABS
1000.0000 mg | ORAL_TABLET | Freq: Once | ORAL | Status: AC
  Administered 2016-03-13: 1000 mg via ORAL
  Filled 2016-03-13: qty 2

## 2016-03-13 MED ORDER — SODIUM CHLORIDE 0.9 % IV SOLN: INTRAVENOUS | Status: DC

## 2016-03-13 MED ORDER — SODIUM CHLORIDE 0.9 % IV BOLUS (SEPSIS): 1000.0000 mL | Freq: Once | INTRAVENOUS | Status: DC

## 2016-03-13 MED ORDER — PIPERACILLIN-TAZOBACTAM 3.375 G IVPB: 3.3750 g | Freq: Three times a day (TID) | INTRAVENOUS | Status: DC

## 2016-03-13 MED ORDER — SODIUM CHLORIDE 0.9 % IV BOLUS (SEPSIS)
1000.0000 mL | Freq: Once | INTRAVENOUS | Status: AC
  Administered 2016-03-13 (×2): 1000 mL via INTRAVENOUS

## 2016-03-13 MED ORDER — PIPERACILLIN-TAZOBACTAM 3.375 G IVPB 30 MIN
3.3750 g | Freq: Once | INTRAVENOUS | Status: AC
  Administered 2016-03-13: 3.375 g via INTRAVENOUS
  Filled 2016-03-13 (×2): qty 50

## 2016-03-13 MED ORDER — VANCOMYCIN HCL IN DEXTROSE 1-5 GM/200ML-% IV SOLN
1000.0000 mg | Freq: Once | INTRAVENOUS | Status: AC
  Administered 2016-03-13: 1000 mg via INTRAVENOUS
  Filled 2016-03-13: qty 200

## 2016-03-13 MED ORDER — VANCOMYCIN HCL IN DEXTROSE 750-5 MG/150ML-% IV SOLN
750.0000 mg | Freq: Three times a day (TID) | INTRAVENOUS | Status: DC
  Filled 2016-03-13: qty 150

## 2016-03-13 NOTE — ED Notes (Signed)
Patient and family requesting not to have BP taken every 30 minutes. Compromised with every hour

## 2016-03-13 NOTE — ED Provider Notes (Addendum)
[facility] Provider Note   CSN: [id number] Arrival date & time: 03/13/16  1734  By signing my name below, I, Modena Jansky, attest that this documentation has been prepared under the direction and in the presence of Vanetta Mulders, MD . Electronically Signed: Modena Jansky, Scribe. 03/13/2016. 6:40 PM.  History   Chief Complaint Chief Complaint  Patient presents with  . Cough   The history is provided by the patient and a relative. No language interpreter was used.   HPI Comments: Ad Palacios is a 55 y.o. male with a hx of ALS and seasonal allergies who presents to the Emergency Department complaining of intermittent moderate cough that started 3 days ago. Family member describes the cough as productive of clear sputum and unrelieved by delsym. Pt has also been burping frequently. She states pt goes to [facility] clinic for ALS treatment and was diagnosed October 2015. Pt is able to move extremities, but lost ability to ambulate in April 2017. Reports no use of oxygen or breathing treatment at home. Denies fever, problems with eating, rhinorrhea, or leg swelling.   Past Medical History:  Diagnosis Date  . ALS (amyotrophic lateral sclerosis) (HCC)   . Foley catheter in place   . Kidney stone   . Left arm weakness   . Left hand weakness   . Left leg weakness     Patient Active Problem List   Diagnosis Date Noted  . 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 Atrophy of the muscles 05/04/2014  . Fasciculations 05/04/2014  . Left arm weakness   . Left hand weakness   . Left leg weakness     Past Surgical History:  Procedure Laterality Date  . APPENDECTOMY    . KIDNEY STONE SURGERY         Home Medications    Prior to Admission medications   Medication Sig Start Date End Date Taking? Authorizing Provider  fluticasone (FLONASE) 50 MCG/ACT nasal spray Place into both nostrils daily.    Historical Provider, MD  loratadine (CLARITIN) 10 MG tablet Take 10 mg by  mouth daily.    Historical Provider, MD  riluzole (RILUTEK) 50 MG tablet TAKE ONE TABLET BY MOUTH EVERY 12 HOURS. 07/13/15   Levert Feinstein, MD    Family History Family History  Problem Relation Age of Onset  . Cancer      Social History Social History  Substance Use Topics  . Smoking status: Never Smoker  . Smokeless tobacco: Never Used  . Alcohol use No     Allergies   Aspirin   Review of Systems Review of Systems  Constitutional: Negative for chills and fever.  HENT: Negative for congestion and rhinorrhea.   Eyes: Negative for visual disturbance.  Respiratory: Positive for cough. Negative for shortness of breath.   Cardiovascular: Negative for chest pain and leg swelling.  Gastrointestinal: Negative for abdominal pain, diarrhea, nausea and vomiting.  Genitourinary: Negative for hematuria.  Neurological: Positive for speech difficulty and weakness. Negative for headaches.  Hematological: Does not bruise/bleed easily.  Psychiatric/Behavioral: Negative for confusion.     Physical Exam Updated Vital Signs BP (!) 160/105 (BP Location: Right Arm)   Pulse (!) 133   Temp 99.2 F (37.3 C) (Oral)   SpO2 93%   Physical Exam  Constitutional: He is oriented to person, place, and time. He appears well-developed and well-nourished. No distress.  HENT:  Head: Normocephalic and atraumatic.  Mouth/Throat: Oropharynx is clear and moist. Mucous membranes are not dry.  Eyes: Conjunctivae and EOM are normal. Pupils are equal, round, and reactive to light. No scleral icterus.  Neck: Neck supple.  Cardiovascular: Regular rhythm.   Tachycardic.   Pulmonary/Chest: Effort normal. No respiratory distress. He has no wheezes. He has no rales.  Abdominal: Soft. Bowel sounds are normal. There is no tenderness.  Musculoskeletal: Normal range of motion. He exhibits no edema.  Neurological: He is alert and oriented to person, place, and time. No cranial nerve deficit. He exhibits normal muscle  tone. Coordination normal.  Skin: Skin is warm and dry.  Psychiatric: He has a normal mood and affect.  Nursing note and vitals reviewed.    ED Treatments / Results  DIAGNOSTIC STUDIES: Oxygen Saturation is 93% on RA, normal by my interpretation.    COORDINATION OF CARE: 6:45 PM- Pt advised of plan for treatment and pt agrees.  Labs (all labs ordered are listed, but only abnormal results are displayed) Labs Reviewed  COMPREHENSIVE METABOLIC PANEL - Abnormal; Notable for the following:       Result Value   BUN 22 (*)    All other components within normal limits  CBC WITH DIFFERENTIAL/PLATELET - Abnormal; Notable for the following:    WBC 20.4 (*)    Neutro Abs 16.6 (*)    Monocytes Absolute 1.6 (*)    All other components within normal limits  URINALYSIS, ROUTINE W REFLEX MICROSCOPIC (NOT AT [facility]) - Abnormal; Notable for the following:    APPearance CLOUDY (*)    Hgb urine dipstick LARGE (*)    Protein, ur 30 (*)    Nitrite POSITIVE (*)    Leukocytes, UA LARGE (*)    All other components within normal limits  URINE MICROSCOPIC-ADD ON - Abnormal; Notable for the following:    Squamous Epithelial / LPF 0-5 (*)    Bacteria, UA MANY (*)    All other components within normal limits  I-STAT CG4 LACTIC ACID, ED - Abnormal; Notable for the following:    Lactic Acid, Venous 2.41 (*)    All other components within normal limits  CULTURE, BLOOD (ROUTINE X 2)  CULTURE, BLOOD (ROUTINE X 2)  URINE CULTURE  LIPASE, BLOOD  I-STAT CG4 LACTIC ACID, ED  I-STAT CG4 LACTIC ACID, ED    EKG  EKG Interpretation  Date/Time:  Wednesday March 13 2016 19:26:53 EDT Ventricular Rate:  137 PR Interval:    QRS Duration: 95 QT Interval:  294 QTC Calculation: 444 R Axis:   -67 Text Interpretation:  Sinus tachycardia Left anterior fascicular block No previous ECGs available Confirmed by Madysun Thall  MD, Matisha Termine [phone]) on 03/13/2016 7:30:18 PM       Radiology Dg Chest Portable 1  View  Result Date: 03/13/2016 CLINICAL DATA:  Cough EXAM: PORTABLE CHEST 1 VIEW COMPARISON:  01/07/2015 FINDINGS: The heart size and mediastinal contours are within normal limits. Decreased lung volumes. Both lungs are clear. The visualized skeletal structures are unremarkable. IMPRESSION: 1. Low lung volumes. 2. No airspace opacities Electronically Signed   By: Signa Kell M.D.   On: 03/13/2016 18:51    Procedures Procedures (including critical care time)  CRITICAL CARE Performed by: Vanetta Mulders Total critical care time: 30 minutes Critical care time was exclusive of separately billable procedures and treating other patients. Critical care was necessary to treat or prevent imminent or life-threatening deterioration. Critical care was time spent personally by me on the following activities: development of treatment plan with patient and/or surrogate as well as nursing, discussions with consultants, evaluation  of patient's response to treatment, examination of patient, obtaining history from patient or surrogate, ordering and performing treatments and interventions, ordering and review of laboratory studies, ordering and review of radiographic studies, pulse oximetry and re-evaluation of patient's condition.   Medications Ordered in ED Medications  0.9 %  sodium chloride infusion (not administered)  sodium chloride 0.9 % bolus 1,000 mL (0 mLs Intravenous Stopped 03/13/16 2036)    And  sodium chloride 0.9 % bolus 1,000 mL (1,000 mLs Intravenous Not Given 03/13/16 2039)    And  sodium chloride 0.9 % bolus 1,000 mL (1,000 mLs Intravenous Not Given 03/13/16 2039)  vancomycin (VANCOCIN) IVPB 750 mg/150 ml premix (not administered)  piperacillin-tazobactam (ZOSYN) IVPB 3.375 g (not administered)  piperacillin-tazobactam (ZOSYN) IVPB 3.375 g (0 g Intravenous Stopped 03/13/16 2000)  vancomycin (VANCOCIN) IVPB 1000 mg/200 mL premix (0 mg Intravenous Stopped 03/13/16 2125)  acetaminophen (TYLENOL)  tablet 1,000 mg (1,000 mg Oral Given 03/13/16 1934)     Initial Impression / Assessment and Plan / ED Course  I have reviewed the triage vital signs and the nursing notes.  Pertinent labs & imaging results that were available during my care of the patient were reviewed by me and considered in my medical decision making (see chart for details).  Clinical Course   Patient with advanced ALS. [facility]. He has no local primary care doctor.  Patient's wife was initially concerned about aspiration and pneumonia. However chest x-rays negative. Patient had Foley catheter placed leg bag 1 week ago for difficulty urinating. Urinalysis is markedly abnormal with positive nitrite to numerous to count red blood cells and white blood cells and bacteria. Patient arrived here tachycardic fever up to 103 lactic acid fortunately normal. Patient treated as sepsis. Patient given total of 3 L of fluid still tachycardic never been hypotensive. Patient also received broad-spectrum antibiotics Zosyn and vancomycin empirically before we do the urine was abnormal.  Discussed with hospitalist at [facility] Dr. Lowell GuitarPowell has accepted the patient for stepdown bed waiting for bed assignment. Patient showing marked improvement here patient also given Tylenol for the fever patient overall feeling much better.  A nonproductive cough is still somewhat worrisome always could be more than 1 thing ongoing but based on chest x-ray no evidence of pneumonia pneumothorax or pulmonary edema.  Patient no longer to ambulate with his ALS. Can still swallow pills patient is still able to nod his head yes or no to most answers and can make some verbalizations.   Final Clinical Impressions(s) / ED Diagnoses   Final diagnoses:  ALS (amyotrophic lateral sclerosis) (HCC)  Sepsis, due to unspecified organism [facility](HCC)  UTI (lower urinary tract infection)    New Prescriptions New Prescriptions   No medications on  file   I personally performed the services described in this documentation, which was scribed in my presence. The recorded information has been reviewed and is accurate.       Vanetta MuldersScott Adolf Ormiston, MD 03/13/16 [id number]    Vanetta MuldersScott Cherrise Occhipinti, MD 03/13/16 2142

## 2016-03-13 NOTE — Progress Notes (Signed)
Pharmacy Antibiotic Note  Steven Burns is a 55 y.o. male admitted on 03/13/2016 with sepsis.  Pharmacy has been consulted for vancomycin and zosyn dosing. Tmax is 103.5 and WBC is elevated at 20.4. Lactic is 1.21 and SCr is WNL. Patient reported weight ~80kg.   Plan: - Vancomycin 1gm IV x 1 (all that is available in pyxis) then 750mg  IV Q8H  - Zosyn 3.375gm IV Q8H (4 hr inf) - F/u renal fxn, C&S, clinical status and trough at SS     Temp (24hrs), Avg:101.4 F (38.6 C), Min:99.2 F (37.3 C), Max:103.5 F (39.7 C)   Recent Labs Lab 03/13/16 1800 03/13/16 1813  WBC 20.4*  --   CREATININE 0.73  --   LATICACIDVEN  --  1.21    CrCl cannot be calculated (Unknown ideal weight.).    Allergies  Allergen Reactions  . Tylenol [Acetaminophen] Rash    Antimicrobials this admission: Vanc 9/13>> Zosyn 9/13>>  Dose adjustments this admission: N/A  Microbiology results: Pending  Thank you for allowing pharmacy to be a part of this patient's care.  Rosealyn Little, Drake LeachRachel Lynn 03/13/2016 7:25 PM

## 2016-03-13 NOTE — ED Triage Notes (Signed)
Per wife pt with cough x 4 days-prod at times-OTC delsym w/o relief-pt is in own motorized w/c-taken to tx room

## 2016-03-14 ENCOUNTER — Telehealth (HOSPITAL_COMMUNITY): Payer: Self-pay

## 2016-03-14 LAB — BLOOD CULTURE ID PANEL (REFLEXED)
Acinetobacter baumannii: NOT DETECTED
CANDIDA GLABRATA: NOT DETECTED
CANDIDA KRUSEI: NOT DETECTED
CANDIDA TROPICALIS: NOT DETECTED
Candida albicans: NOT DETECTED
Candida parapsilosis: NOT DETECTED
Carbapenem resistance: NOT DETECTED
ESCHERICHIA COLI: DETECTED — AB
Enterobacter cloacae complex: NOT DETECTED
Enterobacteriaceae species: DETECTED — AB
Enterococcus species: NOT DETECTED
Haemophilus influenzae: NOT DETECTED
KLEBSIELLA OXYTOCA: NOT DETECTED
Klebsiella pneumoniae: NOT DETECTED
Listeria monocytogenes: NOT DETECTED
NEISSERIA MENINGITIDIS: NOT DETECTED
PROTEUS SPECIES: NOT DETECTED
Pseudomonas aeruginosa: NOT DETECTED
SERRATIA MARCESCENS: NOT DETECTED
STAPHYLOCOCCUS SPECIES: NOT DETECTED
Staphylococcus aureus (BCID): NOT DETECTED
Streptococcus agalactiae: NOT DETECTED
Streptococcus pneumoniae: NOT DETECTED
Streptococcus pyogenes: NOT DETECTED
Streptococcus species: NOT DETECTED

## 2016-03-14 MED ORDER — ACETAMINOPHEN 160 MG/5ML PO SOLN
650.0000 mg | Freq: Once | ORAL | Status: AC
  Administered 2016-03-14: 650 mg via ORAL
  Filled 2016-03-14: qty 20.3

## 2016-03-14 NOTE — Telephone Encounter (Signed)
Lab calling w/(+) blood cx.  Informed them pt transferred to [facility].

## 2016-03-14 NOTE — ED Notes (Signed)
[facility] at the bedside to transport the patient. Report called to the RN at [facility]

## 2016-03-16 LAB — CULTURE, BLOOD (ROUTINE X 2)

## 2016-03-16 LAB — URINE CULTURE: Culture: >=100000 — AB

## 2016-03-18 LAB — CULTURE, BLOOD (ROUTINE X 2): Culture: NO GROWTH

## 2016-07-16 ENCOUNTER — Emergency Department (HOSPITAL_BASED_OUTPATIENT_CLINIC_OR_DEPARTMENT_OTHER): Payer: Worker's Compensation

## 2016-07-16 ENCOUNTER — Encounter (HOSPITAL_BASED_OUTPATIENT_CLINIC_OR_DEPARTMENT_OTHER): Payer: Self-pay | Admitting: *Deleted

## 2016-07-16 ENCOUNTER — Emergency Department (HOSPITAL_BASED_OUTPATIENT_CLINIC_OR_DEPARTMENT_OTHER)
Admission: EM | Admit: 2016-07-16 | Discharge: 2016-07-16 | Disposition: A | Discharge status: Home / Self Care | Payer: Worker's Compensation | Attending: Emergency Medicine | Admitting: Emergency Medicine

## 2016-07-16 DIAGNOSIS — Z79899 Other long term (current) drug therapy: Secondary | ICD-10-CM | POA: Insufficient documentation

## 2016-07-16 DIAGNOSIS — S82841A Displaced bimalleolar fracture of right lower leg, initial encounter for closed fracture: Secondary | ICD-10-CM

## 2016-07-16 DIAGNOSIS — S8254XA Nondisplaced fracture of medial malleolus of right tibia, initial encounter for closed fracture: Secondary | ICD-10-CM | POA: Diagnosis not present

## 2016-07-16 DIAGNOSIS — Y999 Unspecified external cause status: Secondary | ICD-10-CM | POA: Diagnosis not present

## 2016-07-16 DIAGNOSIS — Y929 Unspecified place or not applicable: Secondary | ICD-10-CM | POA: Diagnosis not present

## 2016-07-16 DIAGNOSIS — S99911A Unspecified injury of right ankle, initial encounter: Secondary | ICD-10-CM | POA: Diagnosis present

## 2016-07-16 DIAGNOSIS — W230XXA Caught, crushed, jammed, or pinched between moving objects, initial encounter: Secondary | ICD-10-CM | POA: Diagnosis not present

## 2016-07-16 DIAGNOSIS — Y939 Activity, unspecified: Secondary | ICD-10-CM | POA: Diagnosis not present

## 2016-07-16 NOTE — ED Triage Notes (Signed)
Pt called for triage, no answer

## 2016-07-16 NOTE — ED Triage Notes (Signed)
Pt reports catching his rle in an elevator today, pain to right ankle and foot area, ice pack in place.

## 2016-07-16 NOTE — Discharge Instructions (Signed)
Treatment: Keep splint clean and dry. Elevate your leg when laying down. Try to move your leg at the knee regularly throughout the day to help prevent blood clot. You can take Tylenol or Motrin as prescribed over-the-counter for your pain.  Follow-up: Please follow-up with Dr. Magnus IvanBlackman within 1 week for further evaluation and treatment of your ankle fracture. Please return to the emergency department if you develop any new or worsening symptoms.

## 2016-07-16 NOTE — ED Provider Notes (Signed)
[facility] Provider Note   CSN: [id number] Arrival date & time: 07/16/16  1623  By signing my name below, I, Rosario Adie, attest that this documentation has been prepared under the direction and in the presence of Buel Ream, PA-C.  Electronically Signed: Rosario Adie, ED Scribe. 07/16/16. 7:11 PM.  History   Chief Complaint Chief Complaint  Patient presents with  . Ankle Pain   The history is provided by the patient and the spouse. No language interpreter was used.    HPI Comments: Steven Burns is a 56 y.o. male with a h/o ALS, who presents to the Emergency Department complaining of sudden onset, moderate improving right ankle pain onset earlier this evening. Per wife, pt was getting into an elevator this evening when his right shoe was caught against the closing door and twisted into an awkward position. No fall event. His pain is exacerbated with palpation over the area. No noted treatments were tried prior to coming into the ED. Pt is wheelchair bound at baseline d/t his ALS. He denies chest pain, shortness of breath, abdominal pain, nausea, vomiting, abnormal numbness/tingling/weakness from baseline, or any other associated symptoms.   Past Medical History:  Diagnosis Date  . ALS (amyotrophic lateral sclerosis) (HCC)   . Foley catheter in place   . Kidney stone   . Left arm weakness   . Left hand weakness   . Left leg weakness    Patient Active Problem List   Diagnosis Date Noted  . 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 Atrophy of the muscles 05/04/2014  . Fasciculations 05/04/2014  . Left arm weakness   . Left hand weakness   . Left leg weakness    Past Surgical History:  Procedure Laterality Date  . APPENDECTOMY    . KIDNEY STONE SURGERY      Home Medications    Prior to Admission medications   Medication Sig Start Date End Date Taking? Authorizing Provider  TRAZODONE HCL PO Take by mouth.   Yes Historical Provider, MD    fluticasone (FLONASE) 50 MCG/ACT nasal spray Place into both nostrils daily.    Historical Provider, MD  loratadine (CLARITIN) 10 MG tablet Take 10 mg by mouth daily.    Historical Provider, MD  riluzole (RILUTEK) 50 MG tablet TAKE ONE TABLET BY MOUTH EVERY 12 HOURS. 07/13/15   Levert Feinstein, MD   Family History Family History  Problem Relation Age of Onset  . Cancer     Social History Social History  Substance Use Topics  . Smoking status: Never Smoker  . Smokeless tobacco: Never Used  . Alcohol use No   Allergies   Aspirin  Review of Systems Review of Systems  Respiratory: Negative for shortness of breath.   Cardiovascular: Negative for chest pain.  Gastrointestinal: Negative for abdominal pain, nausea and vomiting.  Musculoskeletal: Positive for arthralgias (right ankle).  Skin: Negative for rash and wound.  Psychiatric/Behavioral: The patient is not nervous/anxious.    Physical Exam Updated Vital Signs BP 165/98 (BP Location: Right Arm)   Pulse 85   Temp 98 F (36.7 C) (Oral)   Resp 18   Ht 6\' 1"  (1.854 m)   Wt 77.1 kg   SpO2 96%   BMI 22.43 kg/m   Physical Exam  Constitutional: He appears well-developed and well-nourished. No distress.  HENT:  Head: Normocephalic and atraumatic.  Eyes: Conjunctivae are normal. Pupils are equal, round, and reactive to light. Right eye exhibits no discharge. Left  eye exhibits no discharge. No scleral icterus.  Neck: Normal range of motion. Neck supple. No thyromegaly present.  Cardiovascular: Normal rate.   Pulmonary/Chest: Effort normal. No stridor.  Abdominal: He exhibits no distension.  Musculoskeletal: He exhibits edema and tenderness.  Right ankle: Edema to the lateral malleolus. Tenderness to medial and lateral malleoli. Pt can dorsiflex and plantar flex. Normal sensation. Capillary refill <2sec. DP pulse intact. No proximal fibular tenderness.   Lymphadenopathy:    He has no cervical adenopathy.  Neurological: He is alert.   Skin: Skin is warm and dry. Capillary refill takes less than 2 seconds. No rash noted. He is not diaphoretic. No pallor.  Psychiatric: He has a normal mood and affect.  Nursing note and vitals reviewed.  ED Treatments / Results  DIAGNOSTIC STUDIES: Oxygen Saturation is 96% on RA, normal by my interpretation.   COORDINATION OF CARE: 7:06 PM-Discussed next steps with pt. Pt verbalized understanding and is agreeable with the plan.   Labs (all labs ordered are listed, but only abnormal results are displayed) Labs Reviewed - No data to display  Radiology Dg Ankle Complete Right  Result Date: 07/16/2016 CLINICAL DATA:  Pain in the ankle after it was caught in an elevator door today. EXAM: RIGHT ANKLE - COMPLETE 3+ VIEW COMPARISON:  None. FINDINGS: Severe bony demineralization and heterogeneity reduces sensitivity and specificity. There is nondisplaced transverse fracture of the medial malleolus. Questionable oblique fracture of the lateral malleolus. Apparent widening of the mortise laterally. Posterior malleolus difficult to assess due to the heterogeneity but without an obvious posterior malleolar fracture. Plantar calcaneal spur. Subtalar joints indistinct on the lateral projection. IMPRESSION: 1. Bony demineralization and heterogeneity. 2. There is an acute transverse nondisplaced medial malleolar fracture and some widening of the lateral mortise. Possible nondisplaced oblique lateral malleolar fracture suggested only on the oblique view. CT could be utilized to better define the fracture pattern, if clinically warranted. Electronically Signed   By: Gaylyn Rong M.D.   On: 07/16/2016 17:42   Procedures Procedures   Medications Ordered in ED Medications - No data to display  Initial Impression / Assessment and Plan / ED Course  I have reviewed the triage vital signs and the nursing notes.  Pertinent labs & imaging results that were available during my care of the patient were  reviewed by me and considered in my medical decision making (see chart for details).  Clinical Course    Patient with right bimalleolar fracture. X-ray shows bony demineralization and heterogeneity; acute transverse nondisplaced medial malleolar fracture and some widening of the lateral mortise, possible nondisplaced oblique lateral malleolar fracture suggested only on the oblique view. Patient hemodynamically and neurovascularly intact. Patient placed in [place] short leg splint. Patient advised to move leg at the knee and elevate leg often throughout the day to prevent DVT. Only over-the-counter pain medication requested by patient for pain control.Follow-up to orthopedics within 1 week. Return precautions discussed. Patient and wife understand and agree with plan. Patient vitals stable throughout ED course and discharged in satisfactory condition. I discussed patient case with Dr. Eudelia Bunch who guided the patient's management and agrees with plan.  Final Clinical Impressions(s) / ED Diagnoses   Final diagnoses:  Closed bimalleolar fracture of right ankle, initial encounter   New Prescriptions New Prescriptions   No medications on file  I personally performed the services described in this documentation, which was scribed in my presence. The recorded information has been reviewed and is accurate.     Gordy Councilman  Rosaria FerriesM Angelina Neece, PA-C 07/16/16 1956    Nira ConnPedro Eduardo Cardama, MD 07/17/16 [phone]

## 2016-07-16 NOTE — ED Notes (Signed)
EMT at the bed side placing splint

## 2016-07-16 NOTE — ED Triage Notes (Signed)
Pt wife requests to wait in a more quiet, private area, this rn phones radiology to let them know that pt and wife will be waiting near pharmacy in [place] area. Radiology will call him there for his xray.

## 2016-07-22 ENCOUNTER — Ambulatory Visit (INDEPENDENT_AMBULATORY_CARE_PROVIDER_SITE_OTHER): Payer: BLUE CROSS/BLUE SHIELD

## 2016-07-22 ENCOUNTER — Encounter (INDEPENDENT_AMBULATORY_CARE_PROVIDER_SITE_OTHER): Payer: Self-pay | Admitting: Physician Assistant

## 2016-07-22 ENCOUNTER — Ambulatory Visit (INDEPENDENT_AMBULATORY_CARE_PROVIDER_SITE_OTHER): Payer: Worker's Compensation

## 2016-07-22 ENCOUNTER — Ambulatory Visit (INDEPENDENT_AMBULATORY_CARE_PROVIDER_SITE_OTHER): Payer: BLUE CROSS/BLUE SHIELD | Admitting: Physician Assistant

## 2016-07-22 VITALS — Ht 73.0 in | Wt 170.0 lb

## 2016-07-22 DIAGNOSIS — S82891A Other fracture of right lower leg, initial encounter for closed fracture: Secondary | ICD-10-CM

## 2016-07-22 NOTE — Progress Notes (Deleted)
   Procedure Note  Patient: Steven Burns             Date of Birth: 17-Sep-1960           MRN: [id number]             Visit Date: 07/22/2016  Procedures: Visit Diagnoses: Closed fracture of right ankle, initial encounter - Plan: XR Ankle 2 Views Right, XR Ankle 2 Views Right  No procedures performed

## 2016-07-22 NOTE — Progress Notes (Signed)
Office Visit Note   Patient: Steven Burns           Date of Birth: 09-10-1960           MRN: [id number] Visit Date: 07/22/2016              Requested by: Juluis Rainier, MD [address] [place], [place] [address] PCP: Gaye Alken, MD   Assessment & Plan: Visit Diagnoses:  1. Closed fracture of right ankle, initial encounter     Plan: Placed in a new Well-padded posterior splint with stirrup with the foot at neutral. Keep this clean dry and intact and is nonweightbearing as possible. 3 views of right ankle on return. Elevation leg wiggling toes encouraged.  Follow-Up Instructions: Return in about 2 weeks (around 08/05/2016) for RADIOGRAPHS, RIGHT ANKLE.   Orders:  Orders Placed This Encounter  Procedures  . XR Ankle 2 Views Right  . XR Ankle 2 Views Right   No orders of the defined types were placed in this encounter.     Procedures: No procedures performed   Clinical Data: No additional findings.   Subjective: Chief Complaint  Patient presents with  . Right Ankle - Fracture    HPI Steven Burns 56 year old male who is referred by the emergency room department for a right ankle fracture. Presents today with his wife. He has ALS and is wheelchair bound. He apparently was getting into an elevator on 07/16/2016 when his right shoe caught against something closing door and twisting his ankle. He is evaluated in the ER and found to have a medial malleolus or transverse fracture nondisplaced with possible lateral malleolus fracture and widening of the lateral ankle mortise. He is placed in a posterior splint and sent here for evaluation and treatment.  Review of Systems   Objective: Vital Signs: Ht 6\' 1"  (1.854 m)   Wt 170 lb (77.1 kg)   BMI 22.43 kg/m   Physical Exam  Constitutional: He appears well-developed and well-nourished. No distress.  Pulmonary/Chest: Effort normal.    Ortho Exam Right lower leg calf supple nontender. No tenderness over  the proximal tib-fib. No rashes skin lesions ulcerations erythema in the right lower leg. Right dorsal pedal pulses intact sensation intact throughout the foot to light touch. No gross deformity of the ankles appreciated. Tenderness  over the medial malleolus. Specialty Comments:  No specialty comments available.  Imaging: Xr Ankle 2 Views Right  Result Date: 07/22/2016 Lateral view right ankle: Films obtained in new plaster splint. Also well located within the ankle mortise.  Xr Ankle 2 Views Right  Result Date: 07/22/2016 AP lateral views right ankle: Films are taken in the splint from the ER. Mild displacement of the medial malleolus fracture fragment overall good alignment. No other fractures identified. Given widening of the lateral mortise is noted. Lateral view shows the talus to be located within the ankle mortise. Disuse osteopenic changes are seen lower leg and foot    PMFS History: Patient Active Problem List   Diagnosis Date Noted  . 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 Atrophy of the muscles 05/04/2014  . Fasciculations 05/04/2014  . Left arm weakness   . Left hand weakness   . Left leg weakness    Past Medical History:  Diagnosis Date  . ALS (amyotrophic lateral sclerosis) (HCC)   . Foley catheter in place   . Kidney stone   . Left arm weakness   . Left hand weakness   . Left leg weakness  Family History  Problem Relation Age of Onset  . Cancer      Past Surgical History:  Procedure Laterality Date  . APPENDECTOMY    . KIDNEY STONE SURGERY     Social History   Occupational History  . Not on file.   Social History Main Topics  . Smoking status: Never Smoker  . Smokeless tobacco: Never Used  . Alcohol use No  . Drug use: No  . Sexual activity: Not on file

## 2016-07-26 IMAGING — DX DG RIBS W/ CHEST 3+V*L*
5 series · 5 of 5 positions shown · non-contrast
Comparison: Chest CT 06/17/2014

CLINICAL DATA: Fall with left posterior and axillary rib
tenderness. Initial encounter.

EXAM:
LEFT RIBS AND CHEST - 3+ VIEW

[chest pa]
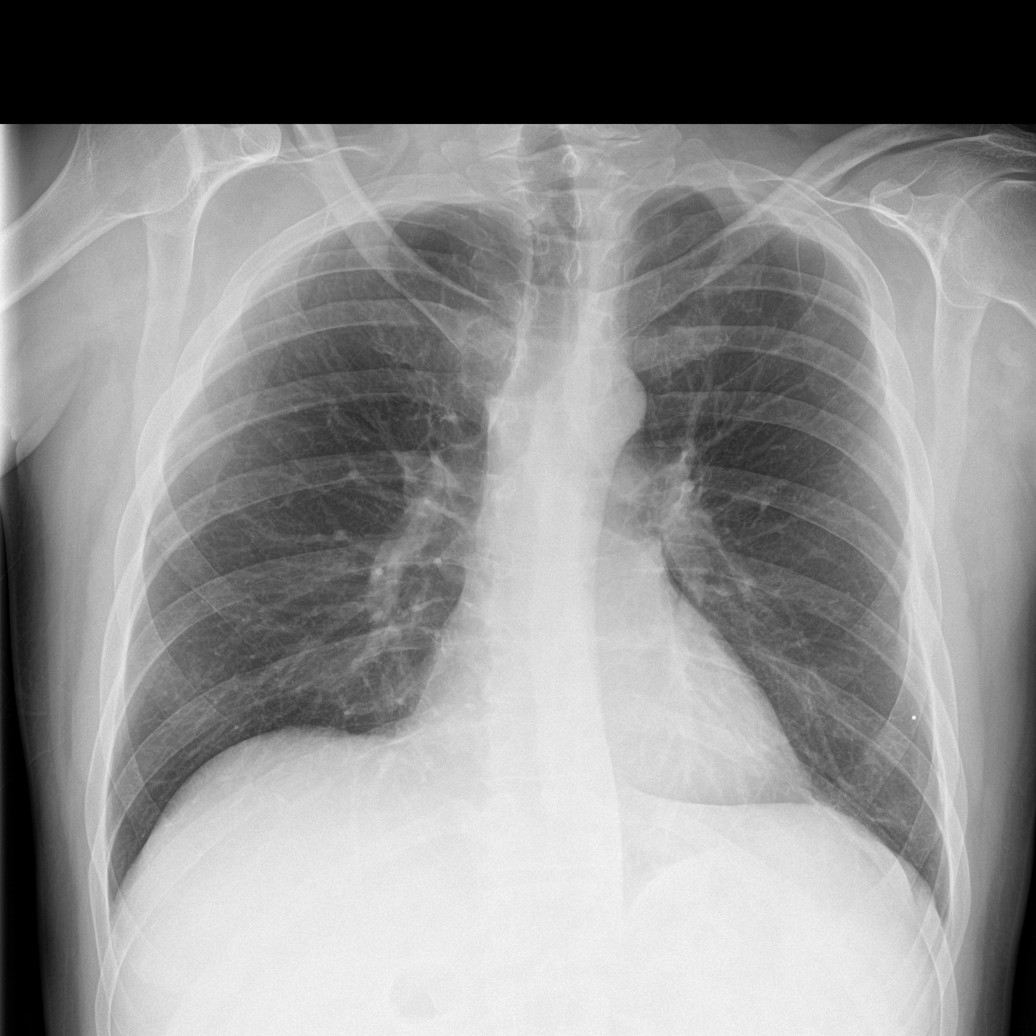

[rib pa obl (1 of 2)]
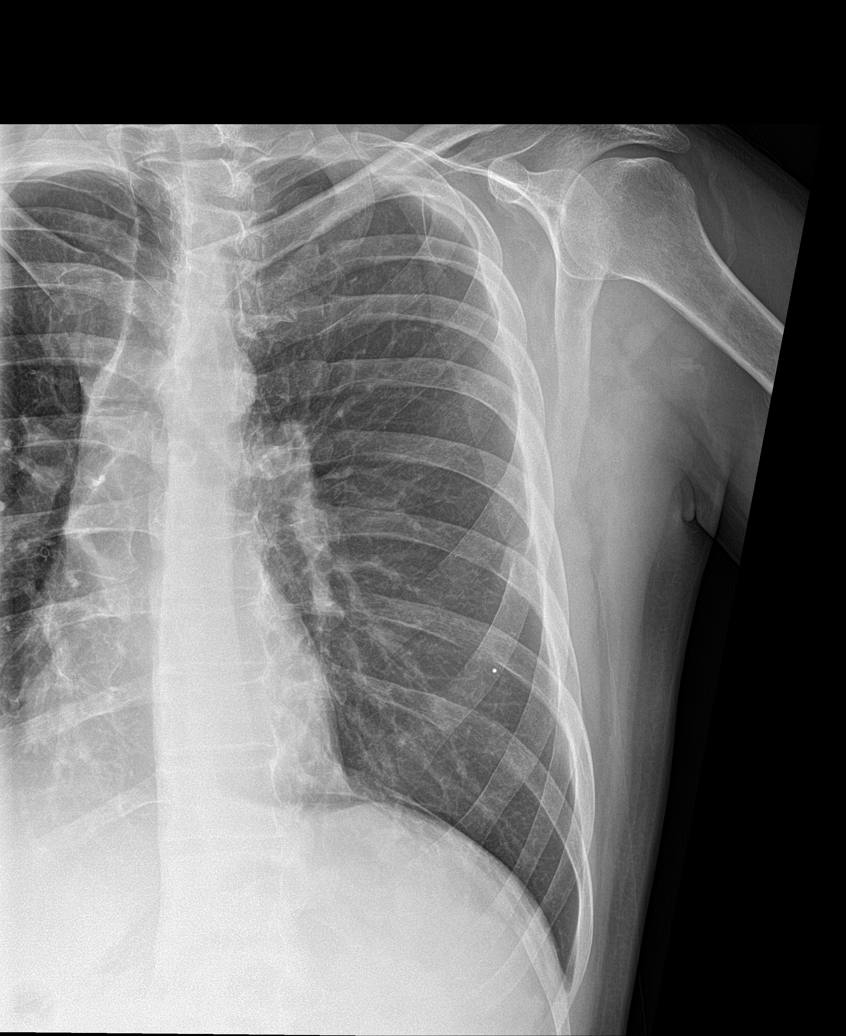

[rib pa obl (2 of 2)]
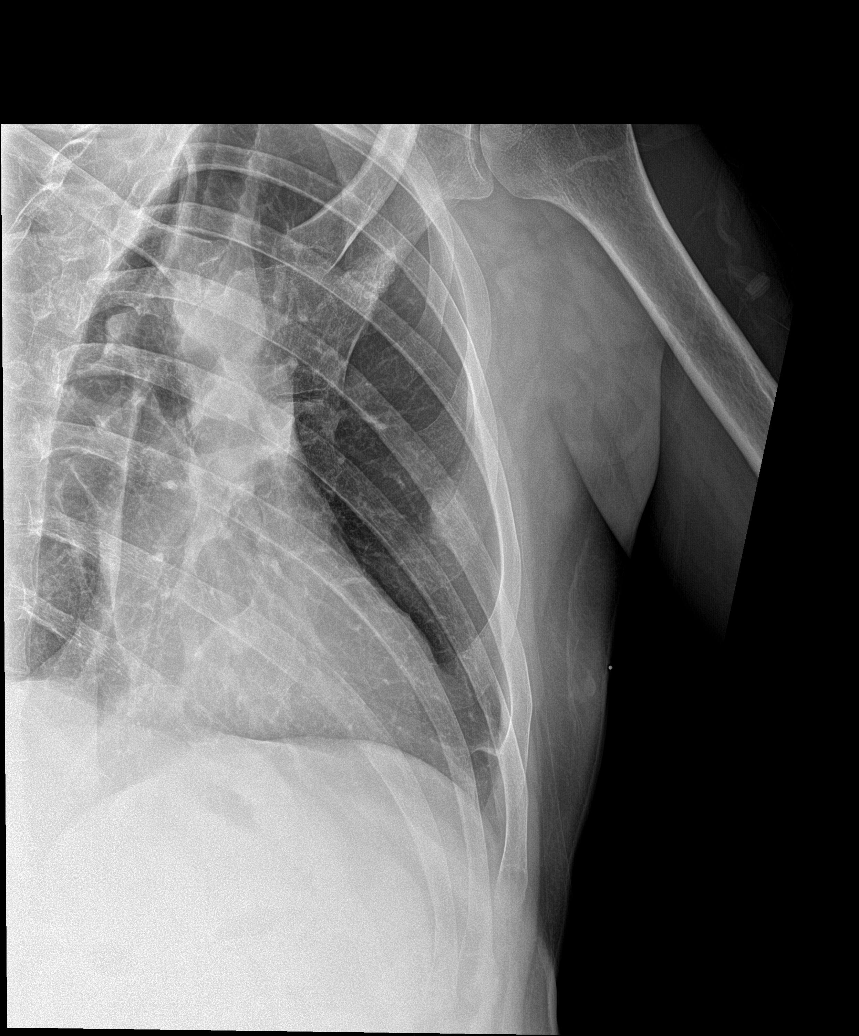

[rib pa]
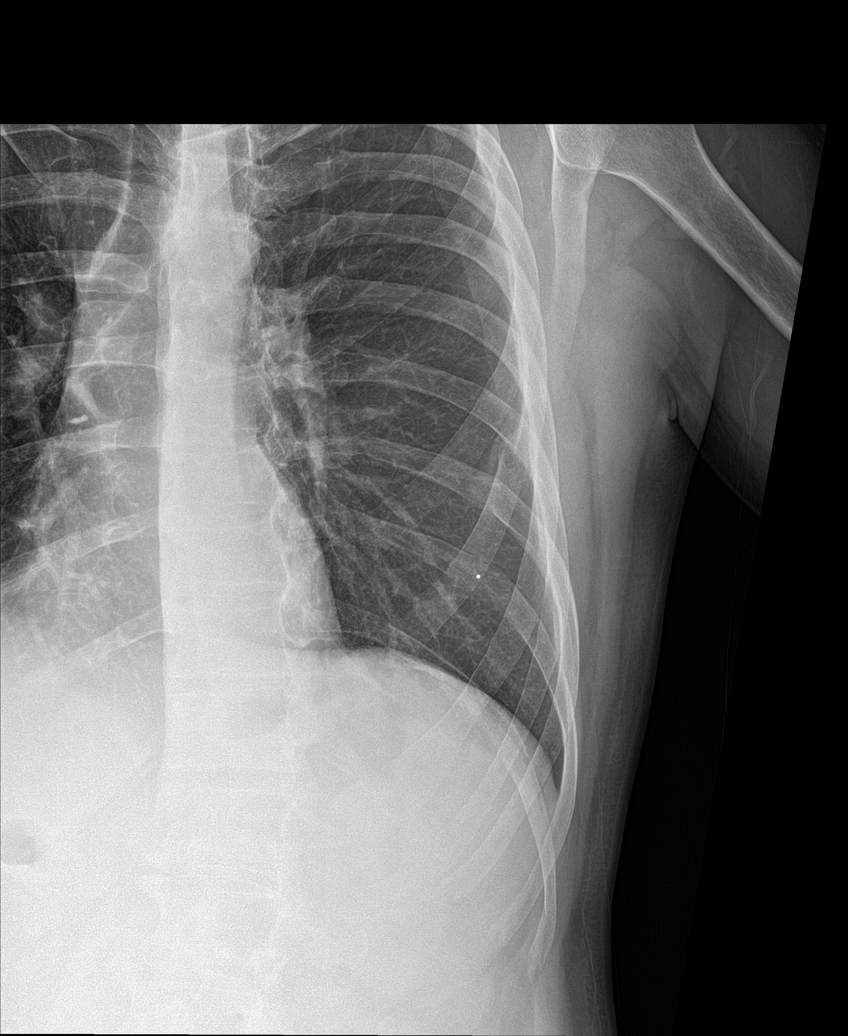

[rib ap obl]
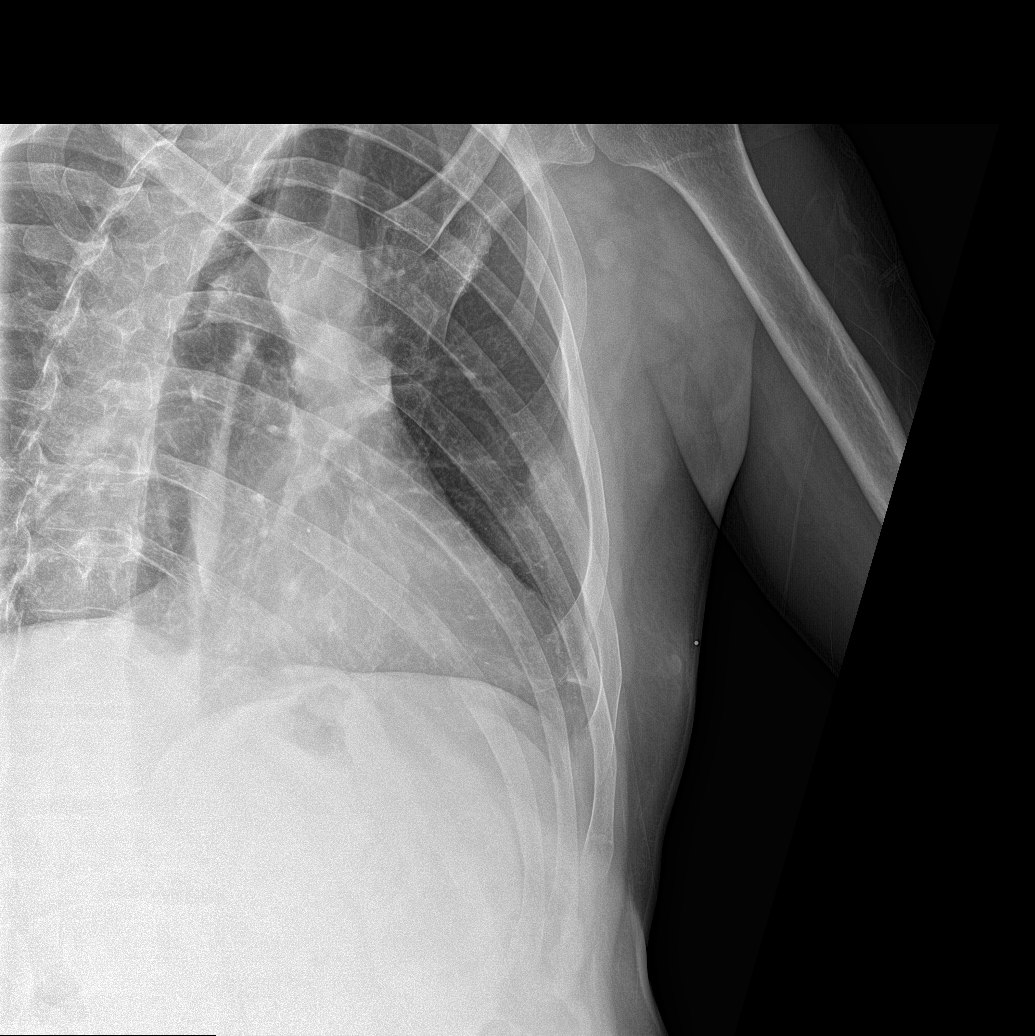

[5 of 5 positions shown; findings below may reference images not displayed]

FINDINGS: Mildly displaced lateral left sixth rib fracture. No evidence of
hemothorax, pneumothorax, or lung contusion. Normal heart size and
mediastinal contours.
IMPRESSION: 1. Displaced left sixth rib fracture.
2. No pneumothorax.

## 2016-07-26 IMAGING — CT CT HEAD W/O CM
1 series · 16 of 30 positions shown, 20 images · non-contrast
Comparison: None.

CLINICAL DATA: Dizziness with fall. Laceration near the left
eyebrow. Initial encounter.

EXAM:
CT HEAD WITHOUT CONTRAST
TECHNIQUE: Contiguous axial images were obtained from the base of the skull
through the vertex without intravenous contrast.

[Series 2: head 4.8 h37s · axial · 0.49mm/px · z∈[-159,-3]mm · 16 of 36 slices shown, 20 images]
[im 2/36  brain]
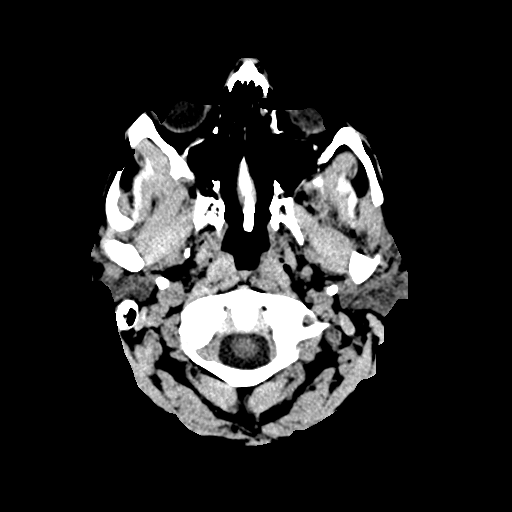
[im 2/36  bone]
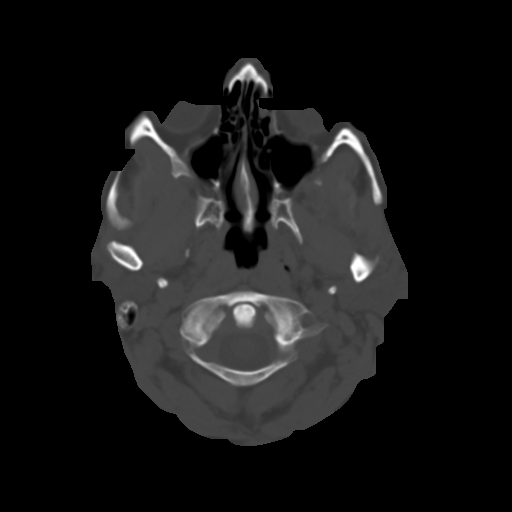
[im 4/36  brain]
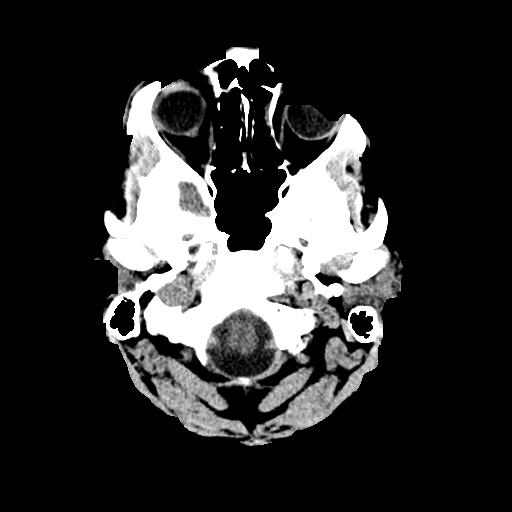
[im 7/36  brain]
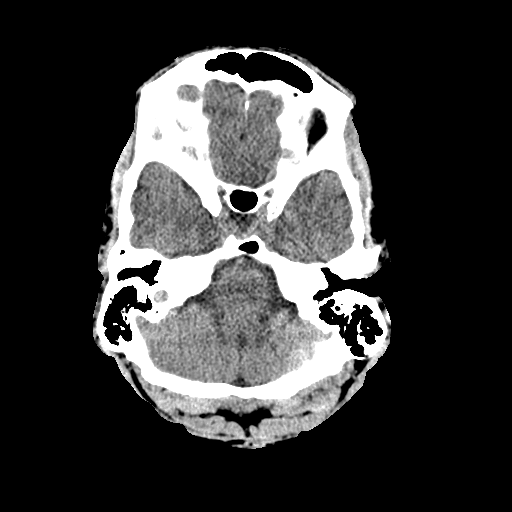
[im 9/36  brain]
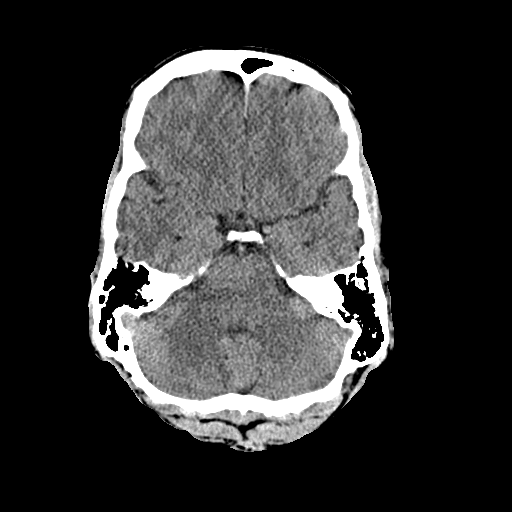
[im 10/36  brain]
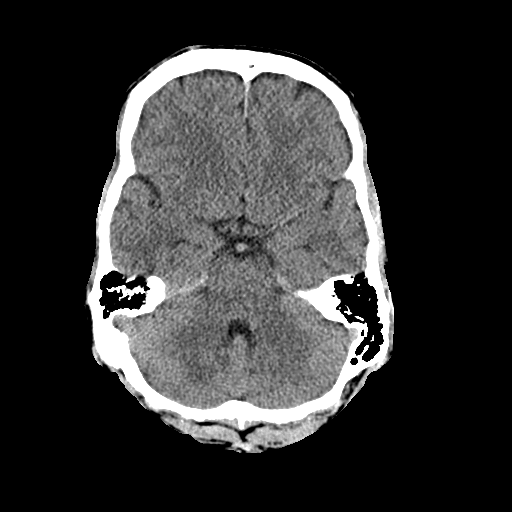
[im 10/36  bone]
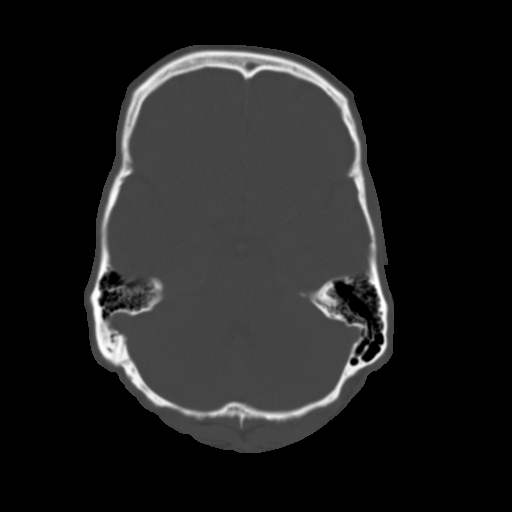
[im 13/36  brain]
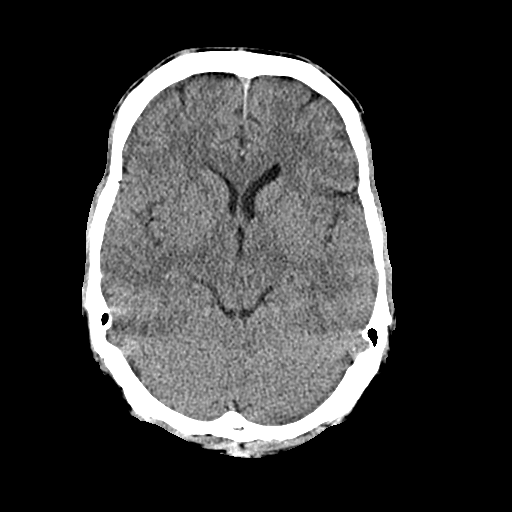
[im 15/36  brain]
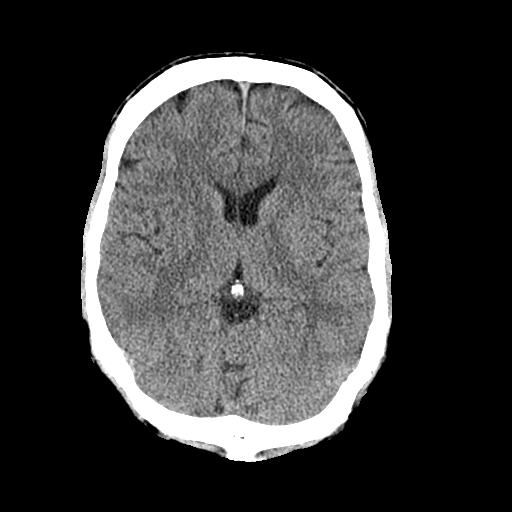
[im 17/36  brain]
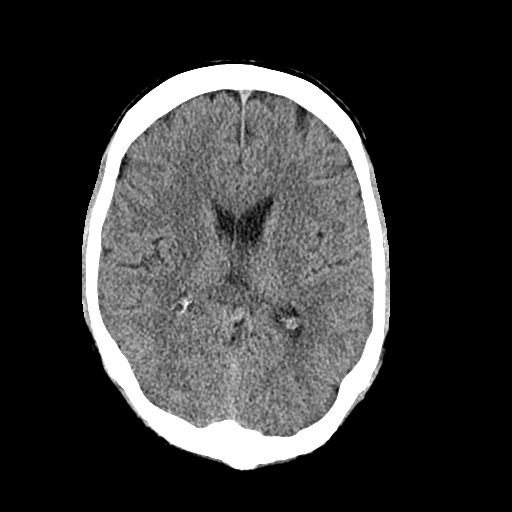
[im 19/36  brain]
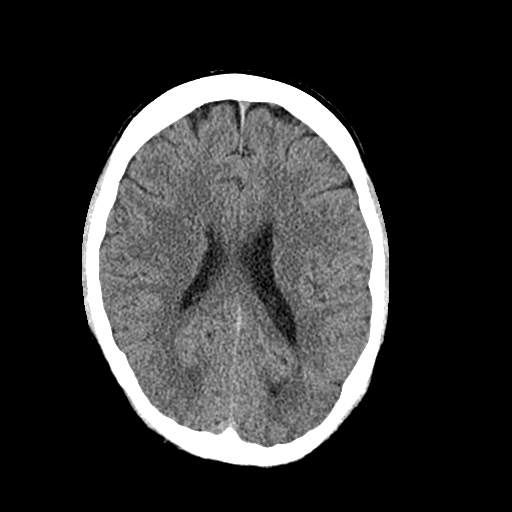
[im 19/36  bone]
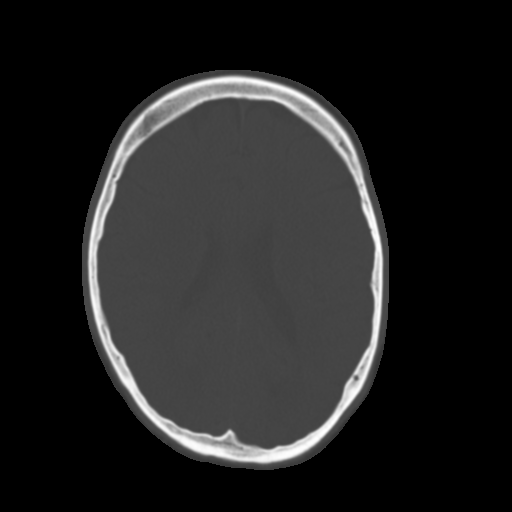
[im 21/36  brain]
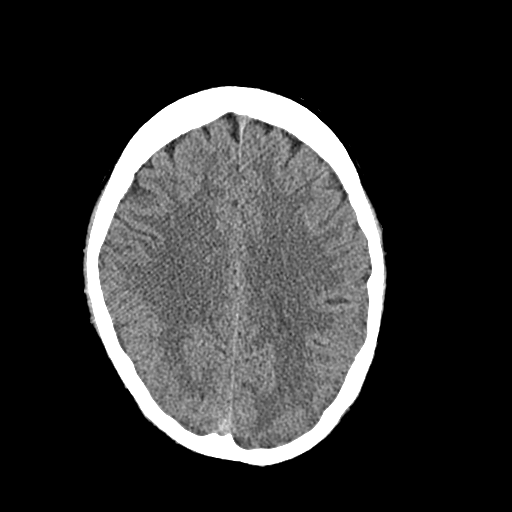
[im 23/36  brain]
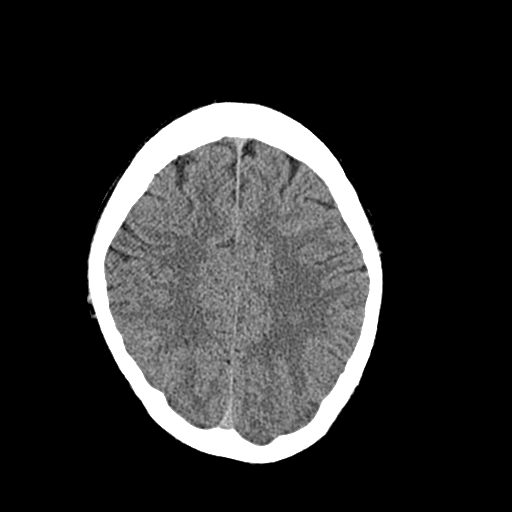
[im 26/36  brain]
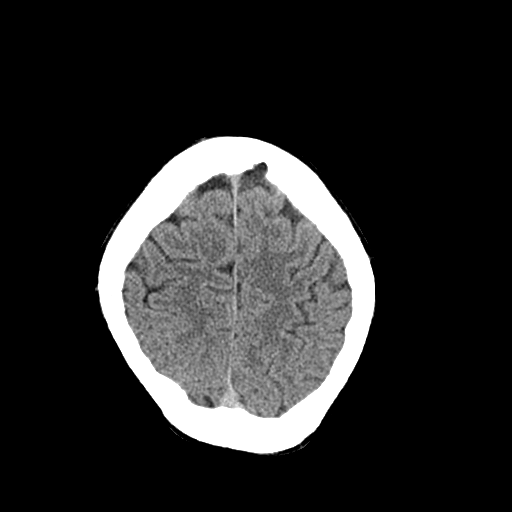
[im 27/36  brain]
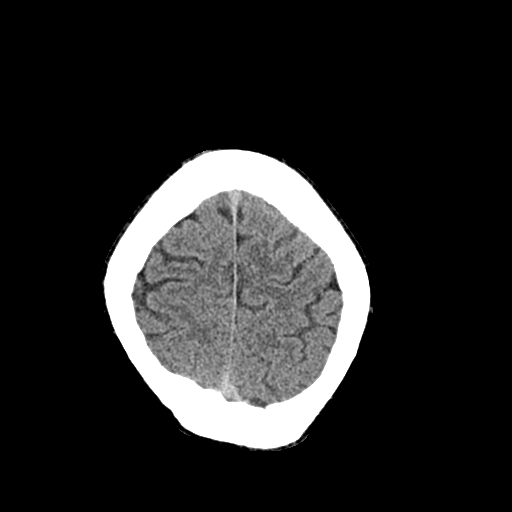
[im 27/36  bone]
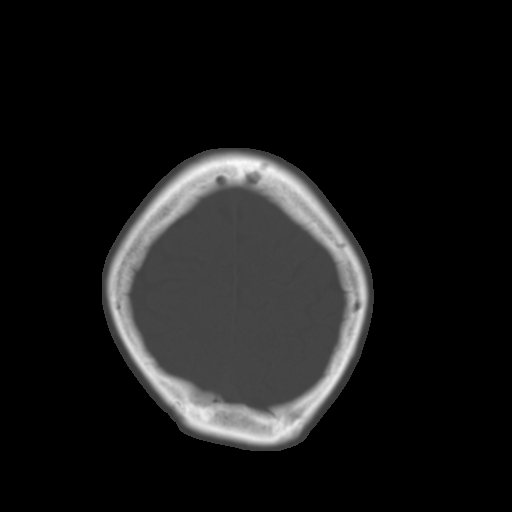
[im 29/36  brain]
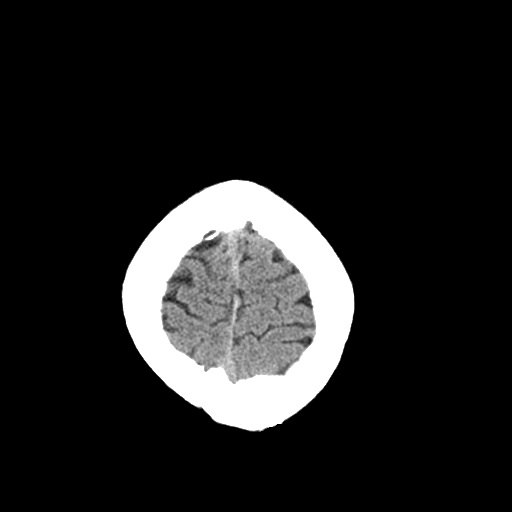
[im 32/36  brain]
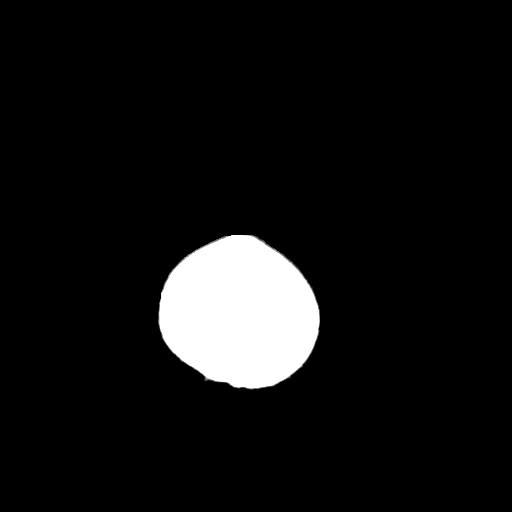
[im 34/36  brain]
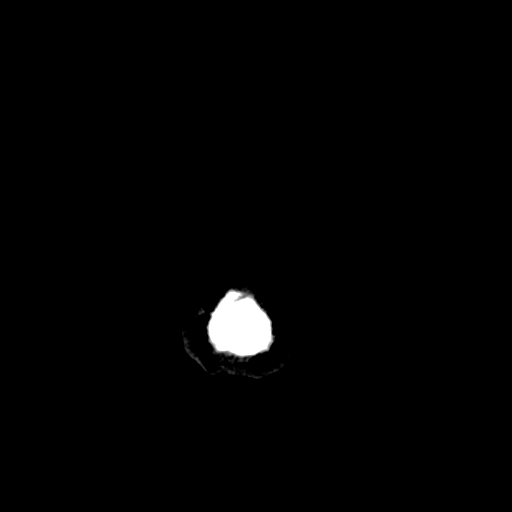

[16 of 30 positions shown; findings below may reference images not displayed]

FINDINGS: Skull and Sinuses:Negative for fracture or destructive process. The
mastoids, middle ears, and imaged paranasal sinuses are clear.

Orbits: No acute abnormality.

Brain: No infarction, hemorrhage, hydrocephalus, or mass lesion/mass
effect.
IMPRESSION: No intracranial injury or fracture.

## 2016-08-05 ENCOUNTER — Ambulatory Visit (INDEPENDENT_AMBULATORY_CARE_PROVIDER_SITE_OTHER): Payer: Self-pay

## 2016-08-05 ENCOUNTER — Ambulatory Visit (INDEPENDENT_AMBULATORY_CARE_PROVIDER_SITE_OTHER): Payer: Worker's Compensation | Admitting: Physician Assistant

## 2016-08-05 ENCOUNTER — Ambulatory Visit (INDEPENDENT_AMBULATORY_CARE_PROVIDER_SITE_OTHER): Payer: BLUE CROSS/BLUE SHIELD | Admitting: Physician Assistant

## 2016-08-05 ENCOUNTER — Encounter (INDEPENDENT_AMBULATORY_CARE_PROVIDER_SITE_OTHER): Payer: Self-pay | Admitting: Physician Assistant

## 2016-08-05 DIAGNOSIS — S82891A Other fracture of right lower leg, initial encounter for closed fracture: Secondary | ICD-10-CM

## 2016-08-05 DIAGNOSIS — M25571 Pain in right ankle and joints of right foot: Secondary | ICD-10-CM

## 2016-08-05 NOTE — Progress Notes (Signed)
   Office Visit Note   Patient: Steven Burns           Date of Birth: 14-May-1961           MRN: [id number] Visit Date: 08/05/2016              Requested by: Juluis RainierElizabeth Barnes, MD [address] [place], [place] [address] PCP: Steven AlkenBARNES,Steven STEWART, MD   Assessment & Plan: Visit Diagnoses:  1. Pain in right ankle and joints of right foot     Plan: A cemented Cam Walker boot weightbearing as tolerated. May remove it when he is seated but when transferring and bridging he needs to wear the Cam Walker boot. Him back in 3 weeks at that point time 3 views of the ankle.  Follow-Up Instructions: Return in about 3 weeks (around 08/26/2016) for Radiographs.   Orders:  Orders Placed This Encounter  Procedures  . XR Ankle Complete Right   No orders of the defined types were placed in this encounter.     Procedures: No procedures performed   Clinical Data: No additional findings.   Subjective: Chief Complaint  Patient presents with  . Right Ankle - Follow-up  . Follow-up    HPI Mr. Steven Burns was in today follow-up of his right ankle medial malleolus fracture. All done well in the well-padded posterior splint with a stirrup. Review of Systems   Objective: Vital Signs: There were no vitals taken for this visit.  Physical Exam  Ortho Exam Right ankle no rashes skin lesions ulcerations or ecchymosis erythema or edema. No impending ulcers. Non tender over the medial malleolus. Remaining right Foot and ankle nontender. Specialty Comments:  No specialty comments available.  Imaging: Xr Ankle Complete Right  Result Date: 08/05/2016 3 views right ankle: Medial malleolus with overall good position alignment signs of early healing. 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 Talus well located within the ankle mortise no diastases    PMFS History: Patient Active Problem List   Diagnosis Date Noted  . 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 Atrophy of the  muscles 05/04/2014  . Fasciculations 05/04/2014  . Left arm weakness   . Left hand weakness   . Left leg weakness    Past Medical History:  Diagnosis Date  . ALS (amyotrophic lateral sclerosis) (HCC)   . Foley catheter in place   . Kidney stone   . Left arm weakness   . Left hand weakness   . Left leg weakness     Family History  Problem Relation Age of Onset  . Cancer      Past Surgical History:  Procedure Laterality Date  . APPENDECTOMY    . KIDNEY STONE SURGERY     Social History   Occupational History  . Not on file.   Social History Main Topics  . Smoking status: Never Smoker  . Smokeless tobacco: Never Used  . Alcohol use No  . Drug use: No  . Sexual activity: Not on file      ---------------------

## 2016-08-26 ENCOUNTER — Ambulatory Visit (INDEPENDENT_AMBULATORY_CARE_PROVIDER_SITE_OTHER): Payer: BLUE CROSS/BLUE SHIELD | Admitting: Physician Assistant

## 2016-08-29 ENCOUNTER — Ambulatory Visit (INDEPENDENT_AMBULATORY_CARE_PROVIDER_SITE_OTHER): Payer: Self-pay

## 2016-08-29 ENCOUNTER — Ambulatory Visit (INDEPENDENT_AMBULATORY_CARE_PROVIDER_SITE_OTHER): Payer: Worker's Compensation | Admitting: Physician Assistant

## 2016-08-29 ENCOUNTER — Encounter (INDEPENDENT_AMBULATORY_CARE_PROVIDER_SITE_OTHER): Payer: Self-pay | Admitting: Physician Assistant

## 2016-08-29 DIAGNOSIS — S82891S Other fracture of right lower leg, sequela: Secondary | ICD-10-CM

## 2016-08-29 DIAGNOSIS — M25571 Pain in right ankle and joints of right foot: Secondary | ICD-10-CM

## 2016-08-29 NOTE — Progress Notes (Signed)
   Office Visit Note   Patient: Steven Burns           Date of Birth: [birth year]           MRN: [id number] Visit Date: 08/29/2016              Requested by: Juluis RainierElizabeth Barnes, MD [address] [place], [place] [address] PCP: Gaye AlkenBARNES,ELIZABETH STEWART, MD   Assessment & Plan: Visit Diagnoses:  1. Pain in right ankle and joints of right foot   2. Closed fracture of right ankle, sequela     Plan: We'll have him discontinue the cam walker boot he is weightbearing as tolerated. Follow-up when necessary.  Follow-Up Instructions: Return if symptoms worsen or fail to improve.   Orders:  Orders Placed This Encounter  Procedures  . XR Ankle Complete Right   No orders of the defined types were placed in this encounter.     Procedures: No procedures performed   Clinical Data: No additional findings.   Subjective: Chief Complaint  Patient presents with  . Right Ankle - Follow-up    Steven Burns is in today follow-up of his right ankle medial malleolus fracture. He states that his ankle is doing okay. His wife states that he actually stopped using the boot a few days ago. He's had no real complaints, just minimal tenderness.    Review of Systems   Objective: Vital Signs: There were no vitals taken for this visit.  Physical Exam  Ortho Exam Right ankle he has minimal tenderness over the medial malleolus. Dorsiflexion plantar flexion ankle. Overall sensation intact throughout the foot. Specialty Comments:  No specialty comments available.  Imaging: Xr Ankle Complete Right  Result Date: 08/29/2016 3 views right ankle: The medial malleolus fracture remains nondisplaced. Is well-healed. 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 No other acute fractures. The talus as well located within the ankle mortise no diastases.    PMFS History: Patient Active Problem List   Diagnosis Date Noted  . 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 Atrophy of the  muscles 05/04/2014  . Fasciculations 05/04/2014  . Left arm weakness   . Left hand weakness   . Left leg weakness    Past Medical History:  Diagnosis Date  . ALS (amyotrophic lateral sclerosis) (HCC)   . Foley catheter in place   . Kidney stone   . Left arm weakness   . Left hand weakness   . Left leg weakness     Family History  Problem Relation Age of Onset  . Cancer      Past Surgical History:  Procedure Laterality Date  . APPENDECTOMY    . KIDNEY STONE SURGERY     Social History   Occupational History  . Not on file.   Social History Main Topics  . Smoking status: Never Smoker  . Smokeless tobacco: Never Used  . Alcohol use No  . Drug use: No  . Sexual activity: Not on file

## 2017-09-30 IMAGING — DX DG CHEST 1V PORT
2 series · 2 of 2 positions shown · non-contrast
Comparison: 01/07/2015

CLINICAL DATA: Cough

EXAM:
PORTABLE CHEST 1 VIEW

[chest ap (1 of 2)]
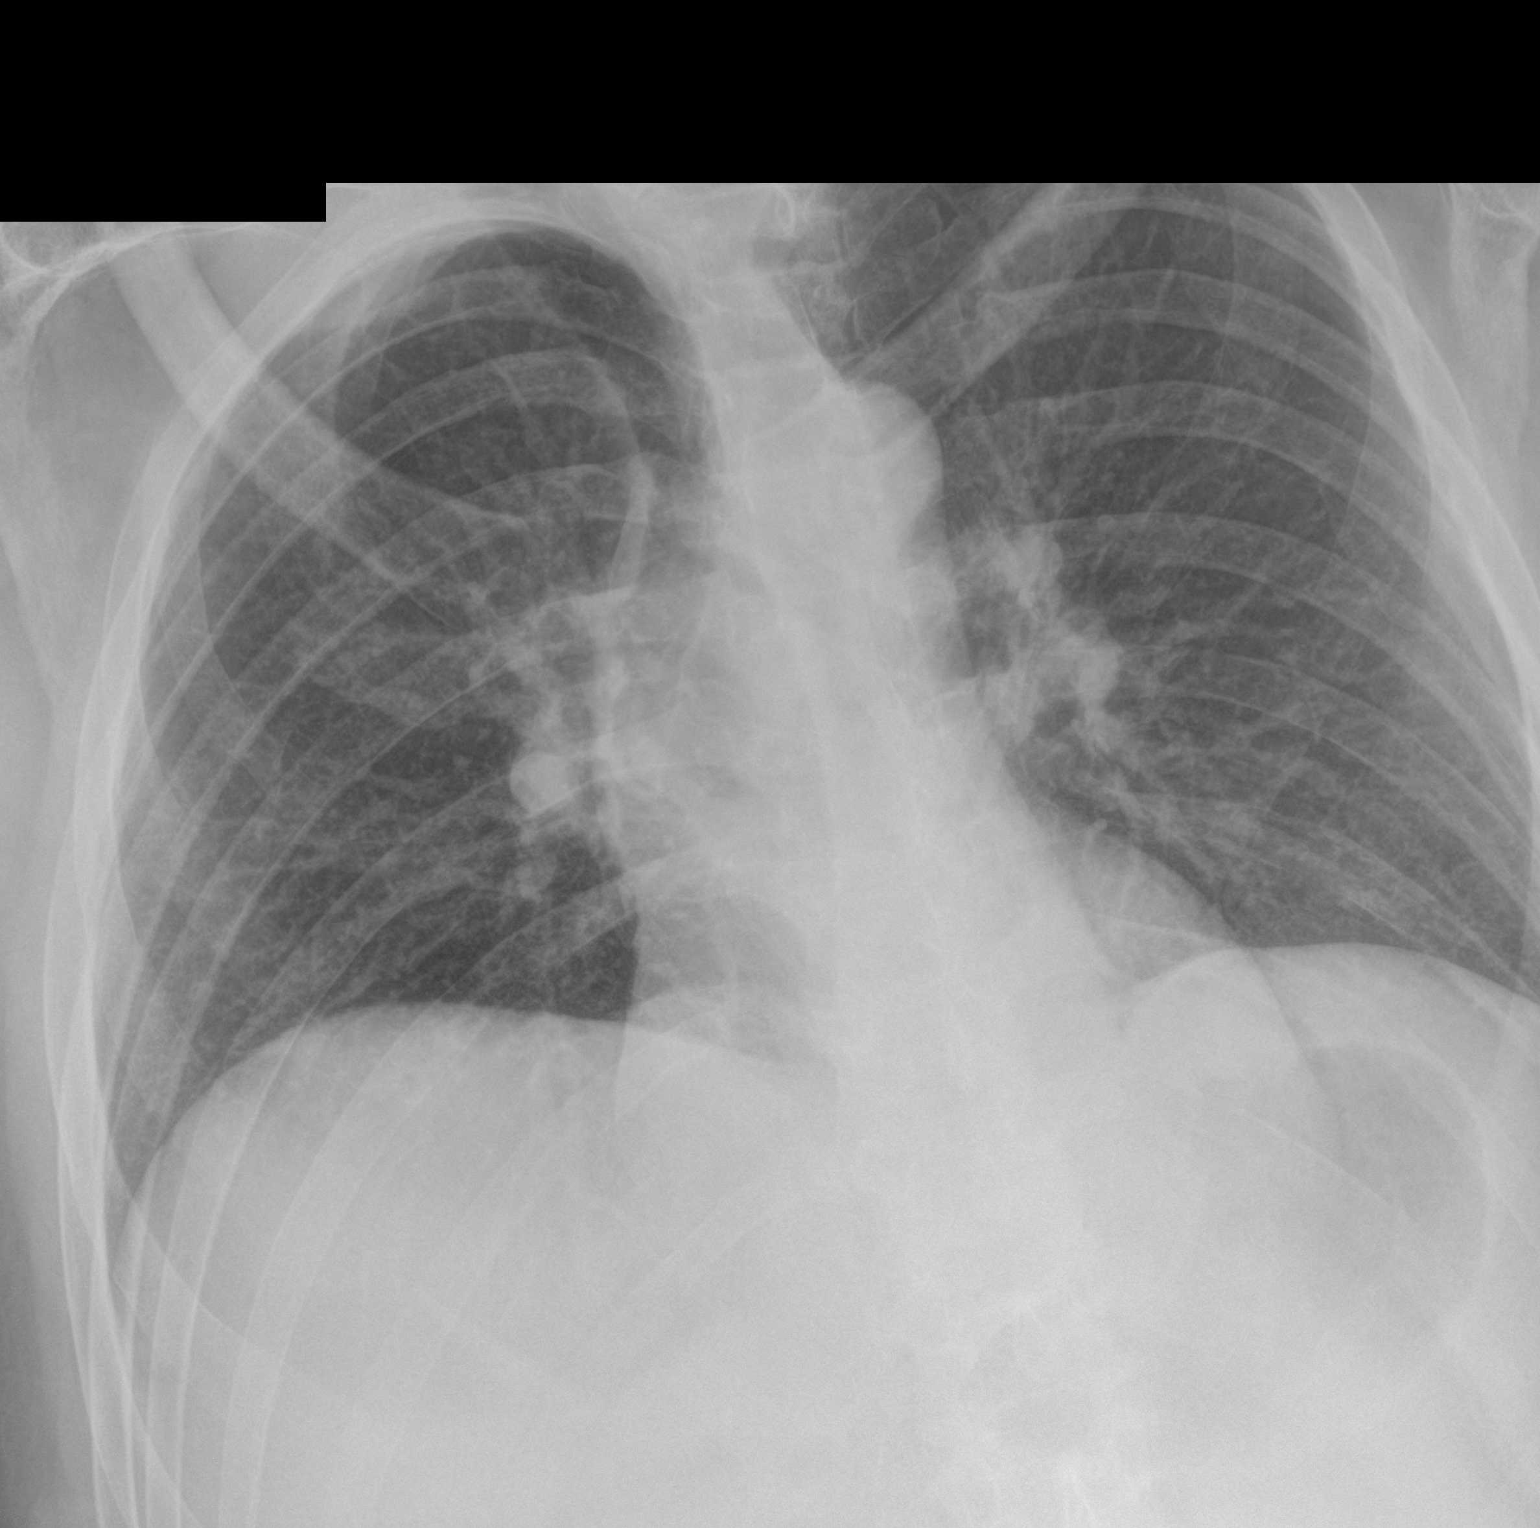

[chest ap (2 of 2)]
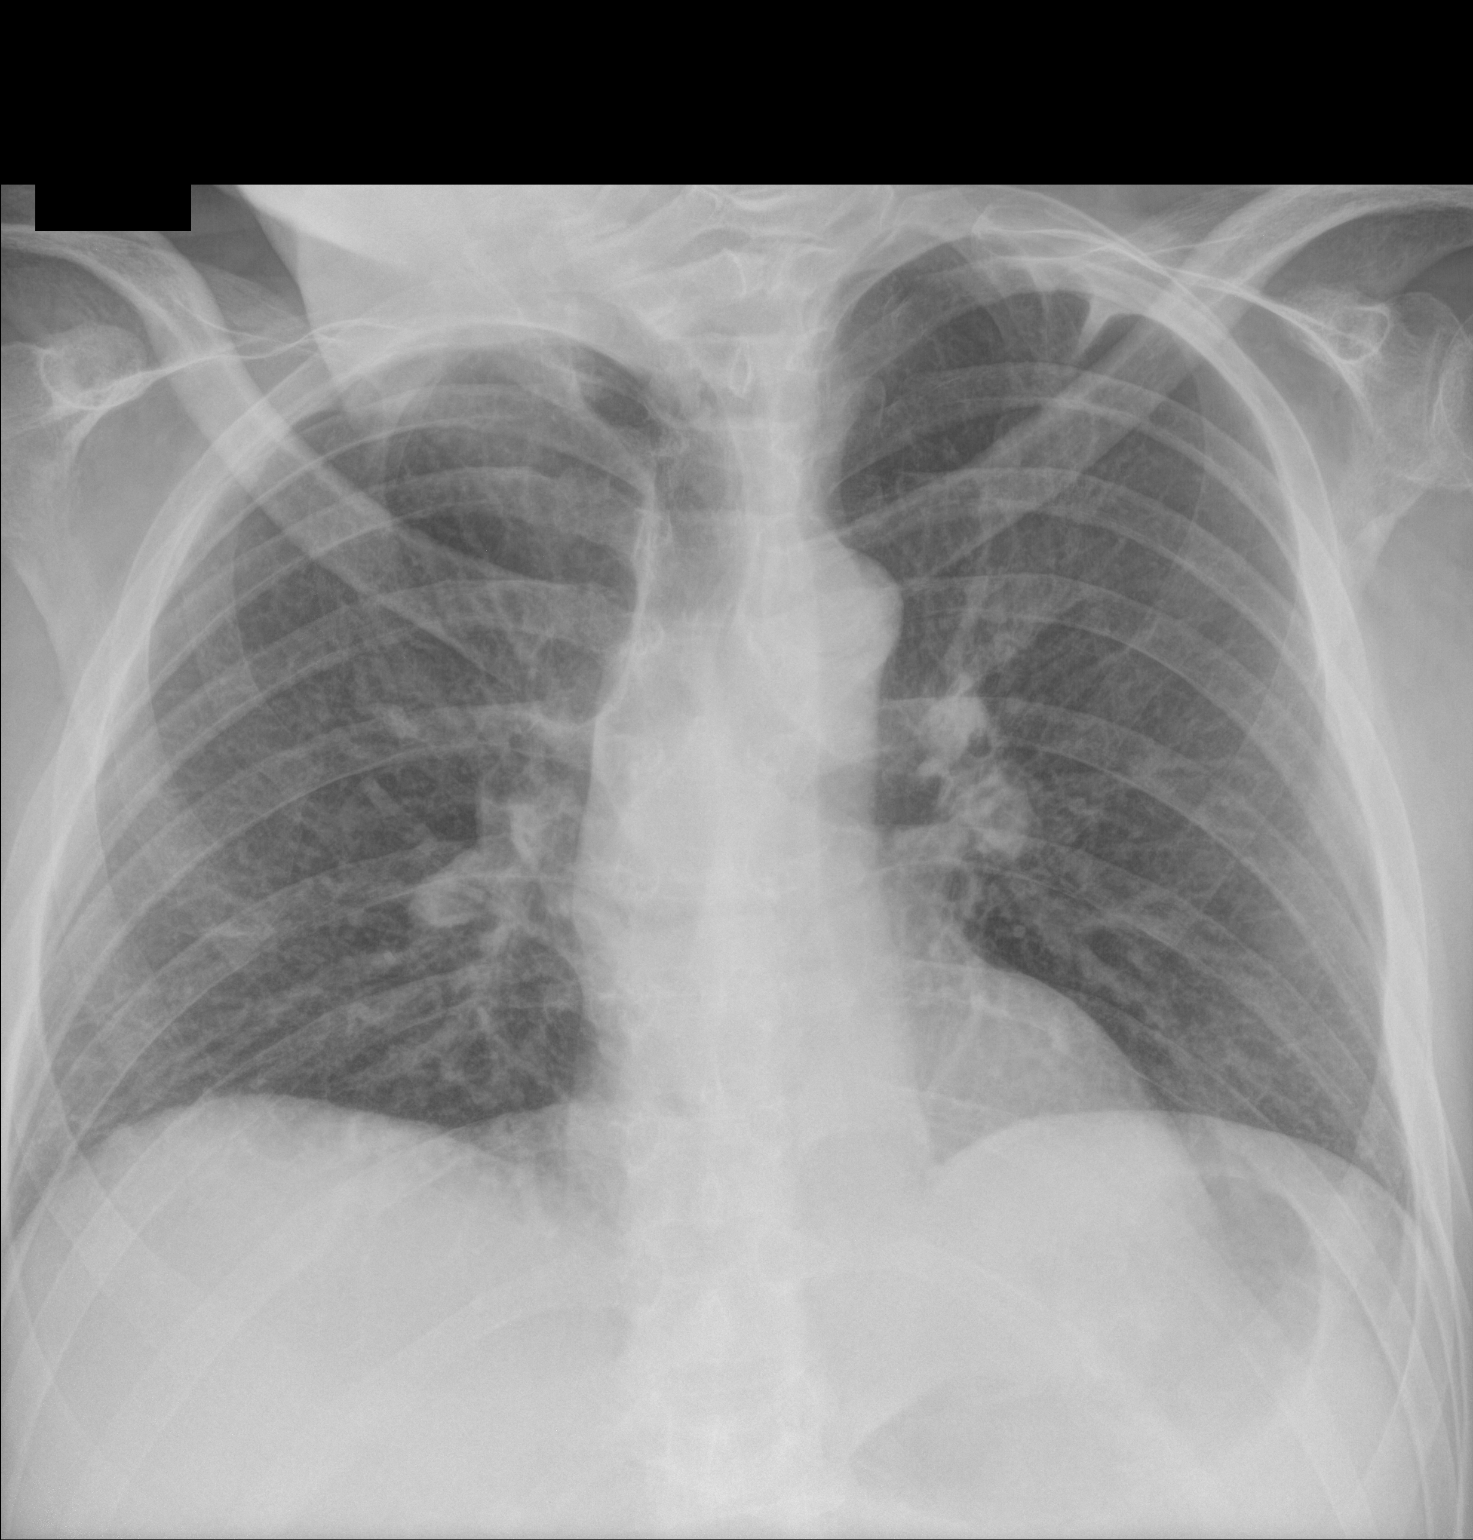

[2 of 2 positions shown; findings below may reference images not displayed]

FINDINGS: The heart size and mediastinal contours are within normal limits.
Decreased lung volumes. Both lungs are clear. The visualized
skeletal structures are unremarkable.
IMPRESSION: 1. Low lung volumes.
2. No airspace opacities

## 2017-12-29 DEATH — deceased

## 2018-12-30 DEATH — deceased
